# Patient Record
Sex: Female | Born: 1950 | Race: Black or African American | Hispanic: No | Marital: Married | Smoking: Current every day smoker
Health system: Southern US, Community
[De-identification: ages and names within clinical notes are randomized; demographics above are authoritative.]

## PROBLEM LIST (undated history)

## (undated) DIAGNOSIS — E0590 Thyrotoxicosis, unspecified without thyrotoxic crisis or storm: Secondary | ICD-10-CM

## (undated) DIAGNOSIS — R7301 Impaired fasting glucose: Secondary | ICD-10-CM

## (undated) DIAGNOSIS — K219 Gastro-esophageal reflux disease without esophagitis: Secondary | ICD-10-CM

## (undated) DIAGNOSIS — M722 Plantar fascial fibromatosis: Secondary | ICD-10-CM

## (undated) DIAGNOSIS — IMO0001 Reserved for inherently not codable concepts without codable children: Secondary | ICD-10-CM

## (undated) DIAGNOSIS — E669 Obesity, unspecified: Secondary | ICD-10-CM

## (undated) DIAGNOSIS — I10 Essential (primary) hypertension: Secondary | ICD-10-CM

## (undated) DIAGNOSIS — N951 Menopausal and female climacteric states: Secondary | ICD-10-CM

## (undated) HISTORY — DX: Reserved for inherently not codable concepts without codable children: IMO0001

## (undated) HISTORY — DX: Gastro-esophageal reflux disease without esophagitis: K21.9

## (undated) HISTORY — DX: Menopausal and female climacteric states: N95.1

## (undated) HISTORY — DX: Obesity, unspecified: E66.9

## (undated) HISTORY — DX: Thyrotoxicosis, unspecified without thyrotoxic crisis or storm: E05.90

## (undated) HISTORY — DX: Essential (primary) hypertension: I10

## (undated) HISTORY — DX: Plantar fascial fibromatosis: M72.2

## (undated) HISTORY — DX: Impaired fasting glucose: R73.01

## (undated) HISTORY — PX: BUNIONECTOMY: SHX129

## (undated) HISTORY — PX: EYE SURGERY: SHX253

---

## 2009-12-05 HISTORY — PX: BREAST CYST ASPIRATION: SHX578

## 2010-09-22 ENCOUNTER — Ambulatory Visit: Payer: Self-pay | Admitting: Family Medicine

## 2011-10-19 ENCOUNTER — Ambulatory Visit: Payer: Self-pay | Admitting: Nephrology

## 2012-09-26 ENCOUNTER — Ambulatory Visit: Payer: Self-pay

## 2012-10-12 ENCOUNTER — Ambulatory Visit: Payer: Self-pay

## 2012-10-22 ENCOUNTER — Ambulatory Visit: Payer: Self-pay | Admitting: Family Medicine

## 2013-09-04 HISTORY — PX: CHOLECYSTECTOMY: SHX55

## 2013-09-18 ENCOUNTER — Ambulatory Visit: Payer: Self-pay | Admitting: Family Medicine

## 2013-09-26 ENCOUNTER — Ambulatory Visit: Payer: Self-pay | Admitting: Family Medicine

## 2013-09-27 ENCOUNTER — Ambulatory Visit: Payer: Self-pay | Admitting: Surgery

## 2013-09-27 LAB — POTASSIUM: Potassium: 3.2 mmol/L — ABNORMAL LOW (ref 3.5–5.1)

## 2013-10-23 ENCOUNTER — Ambulatory Visit: Payer: Self-pay | Admitting: Family Medicine

## 2013-11-12 ENCOUNTER — Ambulatory Visit: Payer: Self-pay | Admitting: Gastroenterology

## 2014-08-29 ENCOUNTER — Ambulatory Visit: Payer: Self-pay | Admitting: Family Medicine

## 2014-09-04 ENCOUNTER — Ambulatory Visit: Payer: Self-pay | Admitting: Family Medicine

## 2014-10-05 ENCOUNTER — Ambulatory Visit: Payer: Self-pay | Admitting: Family Medicine

## 2014-11-04 ENCOUNTER — Ambulatory Visit: Payer: Self-pay | Admitting: Family Medicine

## 2014-12-11 ENCOUNTER — Ambulatory Visit: Payer: Self-pay | Admitting: Family Medicine

## 2014-12-16 ENCOUNTER — Ambulatory Visit: Payer: Self-pay | Admitting: Family Medicine

## 2015-01-05 ENCOUNTER — Ambulatory Visit: Payer: Self-pay | Admitting: Family Medicine

## 2015-03-27 NOTE — Op Note (Signed)
PATIENT NAME:  Laura Kent, Laura Kent MR#:  [id number] DATE OF BIRTH:  [birth year]  DATE OF PROCEDURE:  09/27/2013  PREOPERATIVE DIAGNOSIS: Symptomatic cholelithiasis.   POSTOPERATIVE DIAGNOSIS: Symptomatic cholelithiasis.   SURGEON: Consuela Mimes, M.D.   ANESTHESIA: General.   OPERATION PERFORMED: Laparoscopic cholecystectomy.  PROCEDURE IN DETAIL: The patient was placed supine on the operating room table and prepped and draped in the usual sterile fashion. A 15 mmHg CO2 pneumoperitoneum was created via a Veress needle in the infraumbilical position and this was replaced with a 5 mm trocar and a 30 degree angled laparoscope. Remaining trocars were placed under direct visualization. The fundus of the gallbladder was covered in adherent omental fat and this was taken down from the visceral surface of the liver and from the visceral surface of the gallbladder with the electrocautery and sharp dissection. The fundus was then retracted superiorly and ventrally and the infundibulum was dissected out and retracted laterally opening up the triangle of Calot. Dissection within the triangle of Calot revealed a clear cystic artery and a clear cystic duct, neither of which were associated with the porta hepatis and both of which were going directly into the gallbladder. These were individually doubly clipped and divided and the gallbladder was removed from the liver bed with the electrocautery. It was packed full of stones and sludge and this could not be dealt with and therefore, the entire gallbladder is placed into an Endo Catch bag and extracted from the abdomen via the epigastric port. This port site midline fascia was opened further because the size of the gallbladder and then the peritoneum was temporarily desufflated and this fascia was closed with a running 0 PDS suture extracorporeally. The peritoneum was reinsufflated and reinspected and the area of dissection was hemostatic with no evidence of bile  staining and the clips were secure. Therefore, the peritoneum was desufflated and decannulated and all 4 skin sites were closed with subcuticular 5-0 Monocryl and suture strips. The patient tolerated the procedure well. There were no complications.  ____________________________ Consuela Mimes, MD wfm:aw D: 09/27/2013 11:25:40 ET T: 09/27/2013 11:39:36 ET JOB#: [id number]  cc: Consuela Mimes, MD, <Dictator> Guadalupe Maple, MD Consuela Mimes MD ELECTRONICALLY SIGNED 09/27/2013 17:03

## 2015-05-19 ENCOUNTER — Ambulatory Visit (INDEPENDENT_AMBULATORY_CARE_PROVIDER_SITE_OTHER): Payer: 59 | Admitting: Family Medicine

## 2015-05-19 ENCOUNTER — Other Ambulatory Visit: Payer: Self-pay | Admitting: Family Medicine

## 2015-05-19 ENCOUNTER — Encounter: Payer: Self-pay | Admitting: Family Medicine

## 2015-05-19 VITALS — BP 138/82 | HR 108 | Temp 98.4°F | Wt 219.0 lb

## 2015-05-19 DIAGNOSIS — I10 Essential (primary) hypertension: Secondary | ICD-10-CM | POA: Diagnosis not present

## 2015-05-19 DIAGNOSIS — E669 Obesity, unspecified: Secondary | ICD-10-CM

## 2015-05-19 DIAGNOSIS — R7301 Impaired fasting glucose: Secondary | ICD-10-CM

## 2015-05-19 DIAGNOSIS — Z23 Encounter for immunization: Secondary | ICD-10-CM

## 2015-05-19 MED ORDER — VALSARTAN-HYDROCHLOROTHIAZIDE 160-25 MG PO TABS: 1.0000 | ORAL_TABLET | Freq: Every day | ORAL | Status: DC

## 2015-05-19 MED ORDER — AMLODIPINE BESYLATE 2.5 MG PO TABS: 2.5000 mg | ORAL_TABLET | Freq: Every day | ORAL | Status: DC

## 2015-05-19 MED ORDER — POTASSIUM CHLORIDE CRYS ER 20 MEQ PO TBCR: 20.0000 meq | EXTENDED_RELEASE_TABLET | Freq: Once | ORAL | Status: DC

## 2015-05-19 NOTE — Assessment & Plan Note (Signed)
See patient instructions; modest weight loss encouraged

## 2015-05-19 NOTE — Assessment & Plan Note (Signed)
Continue arb/thiazide combo plus ccb; modest weight loss, dash guidelines; refills given; check creatinine

## 2015-05-19 NOTE — Assessment & Plan Note (Signed)
Limit sweets; try to deal with stress in healthier ways, other than eating; check A1C and glucose today; modest weight loss recommended

## 2015-05-19 NOTE — Patient Instructions (Addendum)
If you need something for aches or pains, try to use Tylenol (acetaminphen) instead of non-steroidals (which include Aleve, ibuprofen, Advil, Motrin, and naproxen); non-steroidals can cause long-term kidney damage Try to use PLAIN allergy medicine without the decongestant Avoid: phenylephrine, phenylpropanolamine, and pseudoephredine Your goal blood pressure is less than 150 on top Try to follow the DASH guidelines (DASH stands for Dietary Approaches to Stop Hypertension) Try to limit the sodium in your diet.  Ideally, consume less than 1.5 grams (less than 1,500mg ) per day. Do not add salt when cooking or at the table.  Check the sodium amount on labels when shopping, and choose items lower in sodium when given a choice. Avoid or limit foods that already contain a lot of sodium. Eat a diet rich in fruits and vegetables and whole grains. Do try to increase your steps, aim for 6,000 steps a day Check out the information at familydoctor.org entitled "What It Takes to Lose Weight" Try to lose between 1-2 pounds per week by taking in fewer calories and burning off more calories You can succeed by limiting portions, limiting foods dense in calories and fat, becoming more active, and drinking 8 glasses of water a day Don't skip meals, especially breakfast, as skipping meals may alter your metabolism Do not use over-the-counter weight loss pills or gimmicks that claim rapid weight loss A healthy BMI (or body mass index) is between 18.5 and 24.9 You can calculate your ideal BMI at the [place] website [employer]fr If you have not heard anything from my staff in a week about any labs if you have not gotten on to [facility] Please do reconsider getting a colonoscopy (I strongly encourage it), let us know when we can schedule that for you

## 2015-05-19 NOTE — Progress Notes (Signed)
BP 138/82 mmHg  Pulse 108  Temp(Src) 98.4 F (36.9 C)  Wt 219 lb (99.338 kg)  SpO2 97%   Subjective:    Patient ID: Laura Kent, female    DOB: 1951/03/21, 64 y.o.   MRN: [id number]  HPI: Laura Kent is a 64 y.o. female  Chief Complaint  Patient presents with  . Hypertension  . Obesity   Hypertension Patient has had hypertension since about 1996 or so Checking blood pressure away from here?  yes How often? Few times a month Range (low to high) over last two weeks:  130 / 80 Hypertension-associated complications:  none If taking medicines, are you taking them regularly?  yes Siblings / family history: Does high blood pressure run in your family?   YES Salt:  Trying to limit sodium / salt when buying foods at the grocery store?  yes  Do you try to limit added salt when cooking and at the table?  yes Sweets/licorice:  Do you eat a lot of sweets or eat black licorice?  YES, lots of sweets but no  licorice Saturated fats: Do you eat a lot of foods like bacon, sausage, pepperoni, cheeseburgers, hot dogs, bologna, and cheese?  no, just twice a wek Sedentary lifestyle:  Exercise/activity level:  seldom (a few times a month) Steroids/Non-steroidals:  Have you had a recent cortisone shot in the last few months?  no  Do you take prednisone or prescription NSAIDs NO, or take OTCS NSAIDs such as ibuprofen, Motrin, Advil, Aleve, or naproxen? Occasional aleve Smoking: Do you smoke?  no Snoring / sleep apnea: Do you snore or have sleep apnea?  no had the test and no sleep apnea Stress: Do you feel like you are under excessive stress or that your stress level affects your blood pressure at times?    no Stroh's (alcohol): Do you drink alcohol  no Sudafed (decongestants): Do you use any OTC decongestant products like Allegra-D, Claritin-D, Zyrtec-D, Tylenol Cold and Sinus, etc.?  no  Impaired fasting glucose She tends to deal with stress by eating sweets; I asked what else she could  do for stress, crochet, walking; calling a friend might stress her out; not getting enough sleep; not walking regularly  Obesity She has not lost any weight since last visit; does not exercise; deals with stress by eating  Health maintenance She does not want a colonoscopy; she refused stool cards; she is willing to have shingles vaccine  Relevant past medical, surgical, family and social history reviewed and updated as indicated. Interim medical history since our last visit reviewed. Allergies and medications reviewed and updated.  Review of Systems  Constitutional: Negative for fever.  Cardiovascular: Positive for leg swelling (little bit near the end of the day after being on feet). Negative for chest pain.  Gastrointestinal: Negative for blood in stool.  Psychiatric/Behavioral: Negative for dysphoric mood.   Per HPI unless specifically indicated above     Objective:    BP 138/82 mmHg  Pulse 108  Temp(Src) 98.4 F (36.9 C)  Wt 219 lb (99.338 kg)  SpO2 97%  Wt Readings from Last 3 Encounters:  05/19/15 219 lb (99.338 kg)  11/12/14 217 lb (98.431 kg)    Physical Exam  Constitutional: She appears well-developed and well-nourished. No distress.  obese  HENT:  Head: Normocephalic and atraumatic.  Eyes: EOM are normal. No scleral icterus.  Neck: No thyromegaly present.  Cardiovascular: Normal rate, regular rhythm and normal heart sounds.   No  murmur heard. Pulmonary/Chest: Effort normal and breath sounds normal. No respiratory distress. She has no wheezes.  Abdominal: Soft. She exhibits no distension.  Musculoskeletal: Normal range of motion. She exhibits no edema.  Neurological: She is alert. She exhibits normal muscle tone.  Skin: Skin is warm and dry. She is not diaphoretic. No pallor.  Psychiatric: She has a normal mood and affect. Her behavior is normal. Judgment and thought content normal.      Assessment & Plan:   Problem List Items Addressed This Visit       Cardiovascular and Mediastinum   Benign hypertension    Continue arb/thiazide combo plus ccb; modest weight loss, dash guidelines; refills given; check creatinine      Relevant Medications   amLODipine (NORVASC) 2.5 MG tablet   valsartan-hydrochlorothiazide (DIOVAN-HCT) 160-25 MG per tablet   Other Relevant Orders   Comprehensive metabolic panel     Endocrine   Impaired fasting glucose    Limit sweets; try to deal with stress in healthier ways, other than eating; check A1C and glucose today; modest weight loss recommended      Relevant Orders   Hgb A1c w/o eAG     Other   Obesity    See patient instructions; modest weight loss encouraged       Other Visit Diagnoses    Need for vaccination    -  Primary    Relevant Orders    Varicella-zoster vaccine subcutaneous       Patient declined offer for stool cards, colonoscopy  Follow up plan: Return in about 6 months (around 11/18/2015) for physical.

## 2015-05-19 NOTE — Addendum Note (Signed)
Addended by: Tasmin Exantus, Satira Anis on: 05/19/2015 09:57 AM   Modules accepted: Miquel Dunn

## 2015-05-20 LAB — COMPREHENSIVE METABOLIC PANEL
ALK PHOS: 80 IU/L (ref 39–117)
ALT: 13 IU/L (ref 0–32)
AST: 13 IU/L (ref 0–40)
Albumin/Globulin Ratio: 1.6 (ref 1.1–2.5)
Albumin: 4.1 g/dL (ref 3.6–4.8)
BUN / CREAT RATIO: 18 (ref 11–26)
BUN: 13 mg/dL (ref 8–27)
CHLORIDE: 104 mmol/L (ref 97–108)
CO2: 26 mmol/L (ref 18–29)
Calcium: 9.7 mg/dL (ref 8.7–10.3)
Creatinine, Ser: 0.72 mg/dL (ref 0.57–1.00)
GFR calc non Af Amer: 90 mL/min/{1.73_m2} (ref >59)
GFR, EST AFRICAN AMERICAN: 104 mL/min/{1.73_m2} (ref >59)
Globulin, Total: 2.5 g/dL (ref 1.5–4.5)
Glucose: 99 mg/dL (ref 65–99)
POTASSIUM: 3.7 mmol/L (ref 3.5–5.2)
SODIUM: 144 mmol/L (ref 134–144)
Total Protein: 6.6 g/dL (ref 6.0–8.5)

## 2015-05-20 LAB — HGB A1C W/O EAG: Hgb A1c MFr Bld: 6.3 % — ABNORMAL HIGH (ref 4.8–5.6)

## 2015-05-21 ENCOUNTER — Encounter: Payer: Self-pay | Admitting: Family Medicine

## 2015-05-21 ENCOUNTER — Telehealth: Payer: Self-pay | Admitting: Family Medicine

## 2015-05-21 MED ORDER — POTASSIUM CHLORIDE CRYS ER 20 MEQ PO TBCR: 20.0000 meq | EXTENDED_RELEASE_TABLET | Freq: Every day | ORAL | Status: DC

## 2015-05-21 NOTE — Telephone Encounter (Signed)
Please let GALIA RAHM know that I'd like to see patient for an appointment here in the office for:  Hypertension, prediabetes, obesity Please schedule a visit with me  in the next: 2-3 weeks Fasting?  Yes please Thank you, Dr. Sanda Klein

## 2015-05-21 NOTE — Telephone Encounter (Signed)
Appt: 6/28 in am.

## 2015-05-21 NOTE — Telephone Encounter (Signed)
E-Fax came through for reill: WI:[id number] chloride SA (K-DUR,KLOR-CON) 20 MEQ tablet

## 2015-06-02 ENCOUNTER — Encounter: Payer: Self-pay | Admitting: Family Medicine

## 2015-06-02 ENCOUNTER — Ambulatory Visit (INDEPENDENT_AMBULATORY_CARE_PROVIDER_SITE_OTHER): Payer: 59 | Admitting: Family Medicine

## 2015-06-02 VITALS — BP 108/77 | HR 96 | Temp 98.1°F | Wt 219.0 lb

## 2015-06-02 DIAGNOSIS — R7301 Impaired fasting glucose: Secondary | ICD-10-CM

## 2015-06-02 DIAGNOSIS — I10 Essential (primary) hypertension: Secondary | ICD-10-CM

## 2015-06-02 DIAGNOSIS — E669 Obesity, unspecified: Secondary | ICD-10-CM

## 2015-06-02 NOTE — Assessment & Plan Note (Signed)
Encouraged walking

## 2015-06-02 NOTE — Assessment & Plan Note (Signed)
As before in earlier note

## 2015-06-02 NOTE — Progress Notes (Signed)
Patient is here in error.  She was just here in the middle of June, had labs done, and then I sent a message to my staff asking her to come in for a visit for obesity, HTN, prediabetes.  She had just been here and had labs and did not need to be seen.  I am not charging her for today's visit since this was not necessary.  -- Dr. Sanda Klein  BP 108/77 mmHg  Pulse 96  Temp(Src) 98.1 F (36.7 C)  Wt 219 lb (99.338 kg)  SpO2 98%   Subjective:    Patient ID: Laura Kent, female    DOB: 1951-05-12, 64 y.o.   MRN: [id number]  HPI: CHARLE MCLAURIN is a 64 y.o. female  Chief Complaint  Patient presents with  . Discuss Labs  . Hypertension  . Obesity  . Hyperglycemia    IFG   Hypertension Patient has had hypertension since 10+ years Checking blood pressure away from here?  yes Range: 120s over 70s or 80s Feels blood pressure is under very good control Hypertension-associated complications:  none If taking medicines, are you taking them regularly?  yes Siblings / family history: Does high blood pressure run in your family?   YES Salt:  Trying to limit sodium / salt when buying foods at the grocery store?  yes  Do you try to limit added salt when cooking and at the table?  yes Sweets/licorice:  Do you eat a lot of sweets or eat black licorice?  YES, encouraged to limit Saturated fats: Do you eat a lot of foods like bacon, sausage, pepperoni, cheeseburgers, hot dogs, bologna, and cheese?  no, occasional hot dog Sedentary lifestyle:  Exercise/activity level:  sedentary (essential NO activity) Steroids/Non-steroidals:  Have you had a recent cortisone shot in the last few months?  no  Do you take prednisone or prescription NSAIDs, no, or take OTCS NSAIDs such as ibuprofen, Motrin, Advil, Aleve, or naproxen? no Smoking: Do you smoke?  no Snoring / sleep apnea: Do you snore or have sleep apnea?  no Stress: Do you feel like you are under excessive stress or that your stress level affects your  blood pressure at times?    no Stroh's (alcohol): Do you drink alcohol  no Sudafed (decongestants): Do you use any OTC decongestant products like Allegra-D, Claritin-D, Zyrtec-D, Tylenol Cold and Sinus, etc.?  YES   Prediabetes Family hx positive for mother, brother, and sister Patient has had prediabetes since 3 years Checking sugars?  no Trying to limit white bread, white rice, white potatoes, sweets?  no Trying to limit sweetened drinks like iced tea, soft drinks, sports drinks, fruit juices?  yes, tea just once a week Exercise/activity level:  sedentary (essential no activity)   Relevant past medical, surgical, family and social history reviewed and updated as indicated. Interim medical history since our last visit reviewed. Allergies and medications reviewed and updated.  Review of Systems  Cardiovascular: Negative for chest pain and leg swelling.       Ankles swell at the end of the day when sitting all day  Gastrointestinal: Negative for blood in stool.  Genitourinary: Negative for hematuria.   Per HPI unless specifically indicated above     Objective:    BP 108/77 mmHg  Pulse 96  Temp(Src) 98.1 F (36.7 C)  Wt 219 lb (99.338 kg)  SpO2 98%  Wt Readings from Last 3 Encounters:  06/02/15 219 lb (99.338 kg)  05/19/15 219 lb (99.338 kg)  11/12/14 217 lb (98.431 kg)    Physical Exam  Constitutional: She appears well-developed and well-nourished.  obese  Cardiovascular: Normal rate and regular rhythm.   Pulmonary/Chest: Effort normal and breath sounds normal.  Musculoskeletal: She exhibits no edema.    Results for orders placed or performed in visit on 05/19/15  Hgb A1c w/o eAG  Result Value Ref Range   Hgb A1c MFr Bld 6.3 (H) 4.8 - 5.6 %  Comprehensive metabolic panel  Result Value Ref Range   Glucose 99 65 - 99 mg/dL   BUN 13 8 - 27 mg/dL   Creatinine, Ser 0.72 0.57 - 1.00 mg/dL   GFR calc non Af Amer 90 >59 mL/min/1.73   GFR calc Af Amer 104 >59 mL/min/1.73    BUN/Creatinine Ratio 18 11 - 26   Sodium 144 134 - 144 mmol/L   Potassium 3.7 3.5 - 5.2 mmol/L   Chloride 104 97 - 108 mmol/L   CO2 26 18 - 29 mmol/L   Calcium 9.7 8.7 - 10.3 mg/dL   Total Protein 6.6 6.0 - 8.5 g/dL   Albumin 4.1 3.6 - 4.8 g/dL   Globulin, Total 2.5 1.5 - 4.5 g/dL   Albumin/Globulin Ratio 1.6 1.1 - 2.5   Bilirubin Total <0.2 0.0 - 1.2 mg/dL   Alkaline Phosphatase 80 39 - 117 IU/L   AST 13 0 - 40 IU/L   ALT 13 0 - 32 IU/L      Assessment & Plan:   Problem List Items Addressed This Visit    None       Follow up plan: No Follow-up on file.

## 2015-06-02 NOTE — Patient Instructions (Addendum)
Let's have you start walking 3 days per week, start at 10 minutes a day and build up gradually Limit whites (white bread, white rice, white potatoes, and sugar) Next A1C is going to be due right around December 15th or just after NO CHARGE for today, since I just saw you two weeks ago

## 2015-06-02 NOTE — Assessment & Plan Note (Signed)
As before in earlier note; next A1C Dec 15th or just after

## 2015-07-21 ENCOUNTER — Other Ambulatory Visit: Payer: Self-pay | Admitting: Family Medicine

## 2015-07-21 NOTE — Telephone Encounter (Signed)
Routing to provider  

## 2015-07-21 NOTE — Telephone Encounter (Signed)
Last potassium reviewed; checked while on ARB, thiazide combo Stable Rx approved

## 2015-10-19 ENCOUNTER — Other Ambulatory Visit: Payer: Self-pay | Admitting: Family Medicine

## 2015-10-19 NOTE — Telephone Encounter (Signed)
Your patient.  Thanks 

## 2015-10-19 NOTE — Telephone Encounter (Signed)
Labs from June 2016 reviewed; rx approved

## 2015-11-16 ENCOUNTER — Ambulatory Visit (INDEPENDENT_AMBULATORY_CARE_PROVIDER_SITE_OTHER): Payer: 59 | Admitting: Family Medicine

## 2015-11-16 ENCOUNTER — Encounter: Payer: Self-pay | Admitting: Family Medicine

## 2015-11-16 VITALS — BP 161/72 | HR 98 | Temp 97.7°F | Ht 65.0 in | Wt 219.0 lb

## 2015-11-16 DIAGNOSIS — R7301 Impaired fasting glucose: Secondary | ICD-10-CM

## 2015-11-16 DIAGNOSIS — Z0000 Encounter for general adult medical examination without abnormal findings: Secondary | ICD-10-CM | POA: Insufficient documentation

## 2015-11-16 DIAGNOSIS — Z1159 Encounter for screening for other viral diseases: Secondary | ICD-10-CM | POA: Diagnosis not present

## 2015-11-16 DIAGNOSIS — E669 Obesity, unspecified: Secondary | ICD-10-CM

## 2015-11-16 DIAGNOSIS — Z124 Encounter for screening for malignant neoplasm of cervix: Secondary | ICD-10-CM | POA: Diagnosis not present

## 2015-11-16 DIAGNOSIS — Z1231 Encounter for screening mammogram for malignant neoplasm of breast: Secondary | ICD-10-CM

## 2015-11-16 DIAGNOSIS — I10 Essential (primary) hypertension: Secondary | ICD-10-CM

## 2015-11-16 DIAGNOSIS — Z114 Encounter for screening for human immunodeficiency virus [HIV]: Secondary | ICD-10-CM | POA: Diagnosis not present

## 2015-11-16 DIAGNOSIS — M79601 Pain in right arm: Secondary | ICD-10-CM | POA: Diagnosis not present

## 2015-11-16 MED ORDER — POTASSIUM CHLORIDE CRYS ER 20 MEQ PO TBCR: 20.0000 meq | EXTENDED_RELEASE_TABLET | Freq: Every day | ORAL | Status: DC

## 2015-11-16 MED ORDER — VALSARTAN-HYDROCHLOROTHIAZIDE 160-25 MG PO TABS: 1.0000 | ORAL_TABLET | Freq: Every day | ORAL | Status: DC

## 2015-11-16 MED ORDER — AMLODIPINE BESYLATE 2.5 MG PO TABS: 2.5000 mg | ORAL_TABLET | Freq: Every day | ORAL | Status: DC

## 2015-11-16 NOTE — Assessment & Plan Note (Addendum)
Check A1c today along with fasting glucose; weight loss important to keep from developing frank diabetes

## 2015-11-16 NOTE — Progress Notes (Signed)
Patient ID: Laura Kent, female   DOB: 1951/12/02, 64 y.o.   MRN: [id number]  Subjective:   Laura Kent is a 64 y.o. female here for a complete physical exam  Interim issues since last visit: no medical excitement  USPSTF grade A and B recommendations Alcohol: well under limit of 7 per week, just holidays Depression:  Depression screen [facility] 2/9 11/16/2015  Decreased Interest 0  Down, Depressed, Hopeless 0  PHQ - 2 Score 0   Hypertension: high today; little stressed about physical part Obesity: stable; not active enough; no real hang-up Tobacco use: used to smoke, quit 2003; did not have 30 pack years HIV, hep B, hep C: HIV, hep C STD testing and prevention (chl/gon/syphilis): declined Lipids: today, fasting Glucose: today, fasting Colorectal cancer: due June 2017 Breast cancer: done Jan 2016; no  Lumps or bumps BRCA gene screening: no fam hx Intimate partner violence: no abuse Cervical cancer screening: today Lung cancer: not 30 pack years Osteoporosis: start at age 60 Fall prevention/vitamin D: not taking vit D AAA: n/a Aspirin: daily aspirin Diet: no fast foods, not enough fruits and veggies; no more than 3 eggs per week Exercise: does not move enough at work; just sitting again; no pets; no standing desk Skin cancer: no worrisome moles; saw dermatologist about mole on leg  Past Medical History  Diagnosis Date  . Hypertension   . IFG (impaired fasting glucose)   . Reflux   . Menopausal syndrome   . Hyperthyroidism     Treated with radioactive iodine 2014  . Obesity    Past Surgical History  Procedure Laterality Date  . Cholecystectomy  Oct 2014  . Bunionectomy     Family History  Problem Relation Age of Onset  . Diabetes Mother   . Kidney disease Mother   . Heart disease Mother   . Hypertension Mother   . Diabetes Father   . Heart disease Father   . Hypertension Father   . Diabetes Sister   . Cancer Brother     leukemia  . Heart disease  Brother     tachycardia  . Stroke Brother     fatal   Social History  Substance Use Topics  . Smoking status: Former Smoker    Quit date: 05/18/2012  . Smokeless tobacco: Never Used  . Alcohol Use: No   Review of Systems  Constitutional: Negative for fever and chills.  HENT: Negative for sore throat.   Respiratory: Negative for shortness of breath and wheezing.   Cardiovascular: Negative for chest pain and leg swelling.  Gastrointestinal: Negative for blood in stool.  Endocrine: Negative for polydipsia.  Genitourinary: Negative for hematuria.  Musculoskeletal:       Rights houlder/arm a little sore after using over thanksgiving; trouble with abduction  Skin:       No worrisome moles  Allergic/Immunologic: Negative for food allergies.  Neurological: Negative for tremors.  Hematological: Does not bruise/bleed easily.  Psychiatric/Behavioral: Negative for dysphoric mood.   Objective:   Filed Vitals:   11/16/15 0907  BP: 161/72  Pulse: 98  Temp: 97.7 F (36.5 C)  Height: '5\' 5"'  (1.651 m)  Weight: 219 lb (99.338 kg)  SpO2: 97%    Body mass index is 36.44 kg/(m^2). Wt Readings from Last 3 Encounters:  11/16/15 219 lb (99.338 kg)  06/02/15 219 lb (99.338 kg)  05/19/15 219 lb (99.338 kg)   Physical Exam  Constitutional: She appears well-developed and well-nourished.  HENT:  Head: Normocephalic  and atraumatic.  Eyes: Conjunctivae and EOM are normal. Right eye exhibits no hordeolum. Left eye exhibits no hordeolum. No scleral icterus.  Neck: Carotid bruit is not present. No thyromegaly present.  Cardiovascular: Normal rate, regular rhythm, S1 normal, S2 normal and normal heart sounds.   No extrasystoles are present.  Pulmonary/Chest: Effort normal and breath sounds normal. No respiratory distress. Right breast exhibits no inverted nipple, no mass, no nipple discharge, no skin change and no tenderness. Left breast exhibits no inverted nipple, no mass, no nipple discharge, no  skin change and no tenderness. Breasts are symmetrical.  Abdominal: Soft. Normal appearance and bowel sounds are normal. She exhibits no distension, no abdominal bruit, no pulsatile midline mass and no mass. There is no hepatosplenomegaly. There is no tenderness. No hernia.  Genitourinary: Uterus normal. Pelvic exam was performed with patient prone. There is no rash or lesion on the right labia. There is no rash or lesion on the left labia. Cervix exhibits no motion tenderness. Right adnexum displays no mass, no tenderness and no fullness. Left adnexum displays no mass, no tenderness and no fullness.  Musculoskeletal: She exhibits no edema.       Right shoulder: She exhibits decreased range of motion.  Lymphadenopathy:       Head (right side): No submandibular adenopathy present.       Head (left side): No submandibular adenopathy present.    She has no cervical adenopathy.    She has no axillary adenopathy.  Neurological: She is alert. She displays no tremor. No cranial nerve deficit. She exhibits normal muscle tone. Gait normal.  Skin: Skin is warm and dry. No bruising and no ecchymosis noted. No cyanosis. No pallor.  Psychiatric: Her speech is normal and behavior is normal. Thought content normal. Her mood appears not anxious. She does not exhibit a depressed mood.    Assessment/Plan:   Problem List Items Addressed This Visit      Cardiovascular and Mediastinum   Benign hypertension    I thought patient's blood pressure was rechecked during her visit here, but I do not see one recorded as I type her note; weight loss was encouraged; DASH guidelines recommended; I will put in a phone note to staff to contact her and ask her to check her BP a few times a week for the next few weeks and call if not under 325 systolic; continue ARB, thiazide, and CCB      Relevant Medications   amLODipine (NORVASC) 2.5 MG tablet   valsartan-hydrochlorothiazide (DIOVAN-HCT) 160-25 MG tablet     Endocrine    Impaired fasting glucose    Check A1c today along with fasting glucose; weight loss important to keep from developing frank diabetes      Relevant Orders   Hgb A1c w/o eAG (Completed)     Other   Obesity    Encouraged weight loss; see AVS      Preventative health care - Primary    USPSTF grade A and B recommendations reviewed with patient; age-appropriate recommendations, preventive care, screening tests, etc discussed and encouraged; healthy living encouraged; see AVS for patient education given to patient      Relevant Orders   TSH (Completed)   CBC with Differential/Platelet (Completed)   Comprehensive metabolic panel (Completed)   Lipid Panel w/o Chol/HDL Ratio (Completed)   Right arm pain    Suspect element of impingement syndrome; will have her try "wall walking" and contact me if she desires PT or ortho referral  Screening for cervical cancer    Thin prep collected today      Relevant Orders   Pap liquid-based and HPV (high risk) (Completed)    Other Visit Diagnoses    Visit for screening mammogram        ordered screening mammogram    Relevant Orders    MM SCREENING BREAST TOMO BILATERAL    Need for hepatitis C screening test        one-time screen per USPSTF guidelines    Relevant Orders    Hepatitis C antibody (Completed)    Screening for HIV (human immunodeficiency virus)        one-time screen per USPSTF guidelines    Relevant Orders    HIV antibody (Completed)       Meds ordered this encounter  Medications  . amLODipine (NORVASC) 2.5 MG tablet    Sig: Take 1 tablet (2.5 mg total) by mouth daily.    Dispense:  90 tablet    Refill:  3  . valsartan-hydrochlorothiazide (DIOVAN-HCT) 160-25 MG tablet    Sig: Take 1 tablet by mouth daily.    Dispense:  90 tablet    Refill:  3  . potassium chloride SA (K-DUR,KLOR-CON) 20 MEQ tablet    Sig: Take 1 tablet (20 mEq total) by mouth daily.    Dispense:  90 tablet    Refill:  0   Orders Placed This  Encounter  Procedures  . MM SCREENING BREAST TOMO BILATERAL    Standing Status: Future     Number of Occurrences:      Standing Expiration Date: 01/16/2017    Order Specific Question:  Reason for Exam (SYMPTOM  OR DIAGNOSIS REQUIRED)    Answer:  yearly screening    Order Specific Question:  Preferred imaging location?    Answer:  [facility]  . TSH  . CBC with Differential/Platelet  . Comprehensive metabolic panel    Order Specific Question:  Has the patient fasted?    Answer:  Yes  . Lipid Panel w/o Chol/HDL Ratio    Order Specific Question:  Has the patient fasted?    Answer:  Yes  . Hgb A1c w/o eAG  . Hepatitis C antibody  . HIV antibody    Follow up plan: Return in about 1 year (around 11/15/2016) for complete physical, and 6 months for next regular f/u.  An after-visit summary was printed and given to the patient at [place].  Please see the patient instructions which may contain other information and recommendations beyond what is mentioned above in the assessment and plan.  Orders Placed This Encounter  Procedures  . MM SCREENING BREAST TOMO BILATERAL  . TSH  . CBC with Differential/Platelet  . Comprehensive metabolic panel  . Lipid Panel w/o Chol/HDL Ratio  . Hgb A1c w/o eAG  . Hepatitis C antibody  . HIV antibody   Meds ordered this encounter  Medications  . amLODipine (NORVASC) 2.5 MG tablet    Sig: Take 1 tablet (2.5 mg total) by mouth daily.    Dispense:  90 tablet    Refill:  3  . valsartan-hydrochlorothiazide (DIOVAN-HCT) 160-25 MG tablet    Sig: Take 1 tablet by mouth daily.    Dispense:  90 tablet    Refill:  3  . potassium chloride SA (K-DUR,KLOR-CON) 20 MEQ tablet    Sig: Take 1 tablet (20 mEq total) by mouth daily.    Dispense:  90 tablet    Refill:  0  Medications Discontinued During This Encounter  Medication Reason  . HYDROcodone-acetaminophen (NORCO/VICODIN) 5-325 MG per tablet Patient has not taken in last 30 days  . amLODipine  (NORVASC) 2.5 MG tablet Reorder  . valsartan-hydrochlorothiazide (DIOVAN-HCT) 160-25 MG per tablet Reorder  . potassium chloride SA (K-DUR,KLOR-CON) 20 MEQ tablet Reorder

## 2015-11-16 NOTE — Patient Instructions (Addendum)
Try some "walk walking" to keep that right shoulder from getting stuck Check out the information at familydoctor.org entitled "What It Takes to Lose Weight" Try to lose between 1-2 pounds per week by taking in fewer calories and burning off more calories You can succeed by limiting portions, limiting foods dense in calories and fat, becoming more active, and drinking 8 glasses of water a day (64 ounces) Don't skip meals, especially breakfast, as skipping meals may alter your metabolism Do not use over-the-counter weight loss pills or gimmicks that claim rapid weight loss A healthy BMI (or body mass index) is between 18.5 and 24.9 You can calculate your ideal BMI at the [place] website [employer]fr  Health Maintenance, Female Adopting a healthy lifestyle and getting preventive care can go a long way to promote health and wellness. Talk with your health care provider about what schedule of regular examinations is right for you. This is a good chance for you to check in with your provider about disease prevention and staying healthy. In between checkups, there are plenty of things you can do on your own. Experts have done a lot of research about which lifestyle changes and preventive measures are most likely to keep you healthy. Ask your health care provider for more information. WEIGHT AND DIET  Eat a healthy diet  Be sure to include plenty of vegetables, fruits, low-fat dairy products, and lean protein.  Do not eat a lot of foods high in solid fats, added sugars, or salt.  Get regular exercise. This is one of the most important things you can do for your health.  Most adults should exercise for at least 150 minutes each week. The exercise should increase your heart rate and make you sweat (moderate-intensity exercise).  Most adults should also do strengthening exercises at least twice a week. This is in addition to the moderate-intensity  exercise.  Maintain a healthy weight  Body mass index (BMI) is a measurement that can be used to identify possible weight problems. It estimates body fat based on height and weight. Your health care provider can help determine your BMI and help you achieve or maintain a healthy weight.  For females 58 years of age and older:   A BMI below 18.5 is considered underweight.  A BMI of 18.5 to 24.9 is normal.  A BMI of 25 to 29.9 is considered overweight.  A BMI of 30 and above is considered obese.  Watch levels of cholesterol and blood lipids  You should start having your blood tested for lipids and cholesterol at 63 years of age, then have this test every 5 years.  You may need to have your cholesterol levels checked more often if:  Your lipid or cholesterol levels are high.  You are older than 63 years of age.  You are at high risk for heart disease.  CANCER SCREENING   Lung Cancer  Lung cancer screening is recommended for adults 31-50 years old who are at high risk for lung cancer because of a history of smoking.  A yearly low-dose CT scan of the lungs is recommended for people who:  Currently smoke.  Have quit within the past 15 years.  Have at least a 30-pack-year history of smoking. A pack year is smoking an average of one pack of cigarettes a day for 1 year.  Yearly screening should continue until it has been 15 years since you quit.  Yearly screening should stop if you develop a health problem that would prevent  you from having lung cancer treatment.  Breast Cancer  Practice breast self-awareness. This means understanding how your breasts normally appear and feel.  It also means doing regular breast self-exams. Let your health care provider know about any changes, no matter how small.  If you are in your 20s or 30s, you should have a clinical breast exam (CBE) by a health care provider every 1-3 years as part of a regular health exam.  If you are 49 or  older, have a CBE every year. Also consider having a breast X-ray (mammogram) every year.  If you have a family history of breast cancer, talk to your health care provider about genetic screening.  If you are at high risk for breast cancer, talk to your health care provider about having an MRI and a mammogram every year.  Breast cancer gene (BRCA) assessment is recommended for women who have family members with BRCA-related cancers. BRCA-related cancers include:  Breast.  Ovarian.  Tubal.  Peritoneal cancers.  Results of the assessment will determine the need for genetic counseling and BRCA1 and BRCA2 testing. Cervical Cancer Your health care provider may recommend that you be screened regularly for cancer of the pelvic organs (ovaries, uterus, and vagina). This screening involves a pelvic examination, including checking for microscopic changes to the surface of your cervix (Pap test). You may be encouraged to have this screening done every 3 years, beginning at age 53.  For women ages 30-65, health care providers may recommend pelvic exams and Pap testing every 3 years, or they may recommend the Pap and pelvic exam, combined with testing for human papilloma virus (HPV), every 5 years. Some types of HPV increase your risk of cervical cancer. Testing for HPV may also be done on women of any age with unclear Pap test results.  Other health care providers may not recommend any screening for nonpregnant women who are considered low risk for pelvic cancer and who do not have symptoms. Ask your health care provider if a screening pelvic exam is right for you.  If you have had past treatment for cervical cancer or a condition that could lead to cancer, you need Pap tests and screening for cancer for at least 20 years after your treatment. If Pap tests have been discontinued, your risk factors (such as having a new sexual partner) need to be reassessed to determine if screening should resume. Some  women have medical problems that increase the chance of getting cervical cancer. In these cases, your health care provider may recommend more frequent screening and Pap tests. Colorectal Cancer  This type of cancer can be detected and often prevented.  Routine colorectal cancer screening usually begins at 63 years of age and continues through 63 years of age.  Your health care provider may recommend screening at an earlier age if you have risk factors for colon cancer.  Your health care provider may also recommend using home test kits to check for hidden blood in the stool.  A small camera at the end of a tube can be used to examine your colon directly (sigmoidoscopy or colonoscopy). This is done to check for the earliest forms of colorectal cancer.  Routine screening usually begins at age 40.  Direct examination of the colon should be repeated every 5-10 years through 63 years of age. However, you may need to be screened more often if early forms of precancerous polyps or small growths are found. Skin Cancer  Check your skin from head to  toe regularly.  Tell your health care provider about any new moles or changes in moles, especially if there is a change in a mole's shape or color.  Also tell your health care provider if you have a mole that is larger than the size of a pencil eraser.  Always use sunscreen. Apply sunscreen liberally and repeatedly throughout the day.  Protect yourself by wearing long sleeves, pants, a wide-brimmed hat, and sunglasses whenever you are outside. HEART DISEASE, DIABETES, AND HIGH BLOOD PRESSURE   High blood pressure causes heart disease and increases the risk of stroke. High blood pressure is more likely to develop in:  People who have blood pressure in the high end of the normal range (130-139/85-89 mm Hg).  People who are overweight or obese.  People who are African American.  If you are 41-43 years of age, have your blood pressure checked every  3-5 years. If you are 31 years of age or older, have your blood pressure checked every year. You should have your blood pressure measured twice--once when you are at a hospital or clinic, and once when you are not at a hospital or clinic. Record the average of the two measurements. To check your blood pressure when you are not at a hospital or clinic, you can use:  An automated blood pressure machine at a pharmacy.  A home blood pressure monitor.  If you are between 62 years and 47 years old, ask your health care provider if you should take aspirin to prevent strokes.  Have regular diabetes screenings. This involves taking a blood sample to check your fasting blood sugar level.  If you are at a normal weight and have a low risk for diabetes, have this test once every three years after 63 years of age.  If you are overweight and have a high risk for diabetes, consider being tested at a younger age or more often. PREVENTING INFECTION  Hepatitis B  If you have a higher risk for hepatitis B, you should be screened for this virus. You are considered at high risk for hepatitis B if:  You were born in a country where hepatitis B is common. Ask your health care provider which countries are considered high risk.  Your parents were born in a high-risk country, and you have not been immunized against hepatitis B (hepatitis B vaccine).  You have HIV or AIDS.  You use needles to inject street drugs.  You live with someone who has hepatitis B.  You have had sex with someone who has hepatitis B.  You get hemodialysis treatment.  You take certain medicines for conditions, including cancer, organ transplantation, and autoimmune conditions. Hepatitis C  Blood testing is recommended for:  Everyone born from [birth year] through 1964.  Anyone with known risk factors for hepatitis C. Sexually transmitted infections (STIs)  You should be screened for sexually transmitted infections (STIs) including  gonorrhea and chlamydia if:  You are sexually active and are younger than 63 years of age.  You are older than 63 years of age and your health care provider tells you that you are at risk for this type of infection.  Your sexual activity has changed since you were last screened and you are at an increased risk for chlamydia or gonorrhea. Ask your health care provider if you are at risk.  If you do not have HIV, but are at risk, it may be recommended that you take a prescription medicine daily to prevent HIV infection. This  is called pre-exposure prophylaxis (PrEP). You are considered at risk if:  You are sexually active and do not regularly use condoms or know the HIV status of your partner(s).  You take drugs by injection.  You are sexually active with a partner who has HIV. Talk with your health care provider about whether you are at high risk of being infected with HIV. If you choose to begin PrEP, you should first be tested for HIV. You should then be tested every 3 months for as long as you are taking PrEP.  PREGNANCY   If you are premenopausal and you may become pregnant, ask your health care provider about preconception counseling.  If you may become pregnant, take 400 to 800 micrograms (mcg) of folic acid every day.  If you want to prevent pregnancy, talk to your health care provider about birth control (contraception). OSTEOPOROSIS AND MENOPAUSE   Osteoporosis is a disease in which the bones lose minerals and strength with aging. This can result in serious bone fractures. Your risk for osteoporosis can be identified using a bone density scan.  If you are 28 years of age or older, or if you are at risk for osteoporosis and fractures, ask your health care provider if you should be screened.  Ask your health care provider whether you should take a calcium or vitamin D supplement to lower your risk for osteoporosis.  Menopause may have certain physical symptoms and  risks.  Hormone replacement therapy may reduce some of these symptoms and risks. Talk to your health care provider about whether hormone replacement therapy is right for you.  HOME CARE INSTRUCTIONS   Schedule regular health, dental, and eye exams.  Stay current with your immunizations.   Do not use any tobacco products including cigarettes, chewing tobacco, or electronic cigarettes.  If you are pregnant, do not drink alcohol.  If you are breastfeeding, limit how much and how often you drink alcohol.  Limit alcohol intake to no more than 1 drink per day for nonpregnant women. One drink equals 12 ounces of beer, 5 ounces of wine, or 1 ounces of hard liquor.  Do not use street drugs.  Do not share needles.  Ask your health care provider for help if you need support or information about quitting drugs.  Tell your health care provider if you often feel depressed.  Tell your health care provider if you have ever been abused or do not feel safe at home.   This information is not intended to replace advice given to you by your health care provider. Make sure you discuss any questions you have with your health care provider.   Document Released: 06/06/2011 Document Revised: 12/12/2014 Document Reviewed: 10/23/2013 Elsevier Interactive Patient Education [employer].

## 2015-11-16 NOTE — Assessment & Plan Note (Signed)
Encouraged weight loss; see AVS 

## 2015-11-17 ENCOUNTER — Encounter: Payer: Self-pay | Admitting: Family Medicine

## 2015-11-17 LAB — CBC WITH DIFFERENTIAL/PLATELET
BASOS ABS: 0 10*3/uL (ref 0.0–0.2)
BASOS: 0 %
EOS (ABSOLUTE): 0.1 10*3/uL (ref 0.0–0.4)
Eos: 1 %
Hematocrit: 43.4 % (ref 34.0–46.6)
Hemoglobin: 14.6 g/dL (ref 11.1–15.9)
IMMATURE GRANS (ABS): 0 10*3/uL (ref 0.0–0.1)
IMMATURE GRANULOCYTES: 0 %
LYMPHS: 39 %
Lymphocytes Absolute: 2.3 10*3/uL (ref 0.7–3.1)
MCH: 28.9 pg (ref 26.6–33.0)
MCHC: 33.6 g/dL (ref 31.5–35.7)
MCV: 86 fL (ref 79–97)
Monocytes Absolute: 0.4 10*3/uL (ref 0.1–0.9)
Monocytes: 6 %
NEUTROS PCT: 54 %
Neutrophils Absolute: 3.1 10*3/uL (ref 1.4–7.0)
PLATELETS: 317 10*3/uL (ref 150–379)
RBC: 5.05 x10E6/uL (ref 3.77–5.28)
RDW: 14.9 % (ref 12.3–15.4)
WBC: 5.8 10*3/uL (ref 3.4–10.8)

## 2015-11-17 LAB — COMPREHENSIVE METABOLIC PANEL
ALT: 13 IU/L (ref 0–32)
AST: 14 IU/L (ref 0–40)
Albumin/Globulin Ratio: 1.7 (ref 1.1–2.5)
Albumin: 4.3 g/dL (ref 3.6–4.8)
Alkaline Phosphatase: 78 IU/L (ref 39–117)
BUN/Creatinine Ratio: 19 (ref 11–26)
BUN: 14 mg/dL (ref 8–27)
Bilirubin Total: 0.5 mg/dL (ref 0.0–1.2)
CALCIUM: 9.7 mg/dL (ref 8.7–10.3)
CO2: 27 mmol/L (ref 18–29)
Chloride: 98 mmol/L (ref 96–106)
Creatinine, Ser: 0.75 mg/dL (ref 0.57–1.00)
GFR, EST AFRICAN AMERICAN: 98 mL/min/{1.73_m2} (ref >59)
GFR, EST NON AFRICAN AMERICAN: 85 mL/min/{1.73_m2} (ref >59)
GLUCOSE: 97 mg/dL (ref 65–99)
Globulin, Total: 2.5 g/dL (ref 1.5–4.5)
Potassium: 3.9 mmol/L (ref 3.5–5.2)
Sodium: 138 mmol/L (ref 134–144)
TOTAL PROTEIN: 6.8 g/dL (ref 6.0–8.5)

## 2015-11-17 LAB — LIPID PANEL W/O CHOL/HDL RATIO
Cholesterol, Total: 135 mg/dL (ref 100–199)
HDL: 57 mg/dL (ref >39)
LDL Calculated: 62 mg/dL (ref 0–99)
TRIGLYCERIDES: 80 mg/dL (ref 0–149)
VLDL CHOLESTEROL CAL: 16 mg/dL (ref 5–40)

## 2015-11-17 LAB — TSH: TSH: 2.49 u[IU]/mL (ref 0.450–4.500)

## 2015-11-17 LAB — HGB A1C W/O EAG: HEMOGLOBIN A1C: 6.1 % — AB (ref 4.8–5.6)

## 2015-11-17 LAB — HEPATITIS C ANTIBODY

## 2015-11-17 LAB — HIV ANTIBODY (ROUTINE TESTING W REFLEX): HIV Screen 4th Generation wRfx: NONREACTIVE

## 2015-11-20 LAB — PAP LB AND HPV HIGH-RISK
HPV, high-risk: NEGATIVE
PAP Smear Comment: 0

## 2015-11-21 ENCOUNTER — Telehealth: Payer: Self-pay | Admitting: Family Medicine

## 2015-11-21 NOTE — Assessment & Plan Note (Signed)
Thin prep collected today 

## 2015-11-21 NOTE — Telephone Encounter (Signed)
Please call patient As I was finishing her note, I realized her blood pressure was high I really thought we rechecked her blood pressure, but I can't find it Please call her and let her know that her BP was high at our office and I'd like to have her check her own BP a few times a week over the next 2-3 weeks and to please call us with an update after the new year If the top number is staying higher than 150 mmHg, we'll increase her amlodipine, so please have her call us with those numbers

## 2015-11-21 NOTE — Assessment & Plan Note (Signed)
USPSTF grade A and B recommendations reviewed with patient; age-appropriate recommendations, preventive care, screening tests, etc discussed and encouraged; healthy living encouraged; see AVS for patient education given to patient  

## 2015-11-21 NOTE — Assessment & Plan Note (Signed)
Suspect element of impingement syndrome; will have her try "wall walking" and contact me if she desires PT or ortho referral

## 2015-11-21 NOTE — Assessment & Plan Note (Signed)
I thought patient's blood pressure was rechecked during her visit here, but I do not see one recorded as I type her note; weight loss was encouraged; DASH guidelines recommended; I will put in a phone note to staff to contact her and ask her to check her BP a few times a week for the next few weeks and call if not under [id number] systolic; continue ARB, thiazide, and CCB

## 2015-11-23 NOTE — Telephone Encounter (Signed)
I spoke with patient, she says that someone did recheck her BP and she thinks it was 129/72. She will check it at home and call back with an update as requested.

## 2015-12-15 ENCOUNTER — Ambulatory Visit
Admission: RE | Admit: 2015-12-15 | Discharge: 2015-12-15 | Disposition: A | Discharge status: Home / Self Care | Payer: 59 | Source: Ambulatory Visit | Attending: Family Medicine | Admitting: Family Medicine

## 2015-12-15 ENCOUNTER — Other Ambulatory Visit: Payer: Self-pay | Admitting: Family Medicine

## 2015-12-15 DIAGNOSIS — Z1231 Encounter for screening mammogram for malignant neoplasm of breast: Secondary | ICD-10-CM | POA: Diagnosis present

## 2016-01-14 ENCOUNTER — Other Ambulatory Visit: Payer: Self-pay | Admitting: Family Medicine

## 2016-01-14 NOTE — Telephone Encounter (Signed)
Last phone note reivewed; last K+ reviewed; Rx approved

## 2016-05-16 ENCOUNTER — Ambulatory Visit: Payer: 59 | Admitting: Family Medicine

## 2016-05-25 ENCOUNTER — Encounter: Payer: Self-pay | Admitting: Family Medicine

## 2016-05-25 ENCOUNTER — Ambulatory Visit (INDEPENDENT_AMBULATORY_CARE_PROVIDER_SITE_OTHER): Payer: 59 | Admitting: Family Medicine

## 2016-05-25 ENCOUNTER — Other Ambulatory Visit: Payer: Self-pay | Admitting: Family Medicine

## 2016-05-25 VITALS — BP 120/76 | HR 87 | Temp 98.8°F | Resp 16 | Wt 229.0 lb

## 2016-05-25 DIAGNOSIS — E669 Obesity, unspecified: Secondary | ICD-10-CM

## 2016-05-25 DIAGNOSIS — I10 Essential (primary) hypertension: Secondary | ICD-10-CM

## 2016-05-25 DIAGNOSIS — Z5181 Encounter for therapeutic drug level monitoring: Secondary | ICD-10-CM

## 2016-05-25 DIAGNOSIS — R7301 Impaired fasting glucose: Secondary | ICD-10-CM | POA: Diagnosis not present

## 2016-05-25 NOTE — Patient Instructions (Addendum)
Return on another day for labs (please come fasting) Okay to have water, black coffee, medicines, etc.  Your goal blood pressure is less than 150 mmHg on top. Try to follow the DASH guidelines (DASH stands for Dietary Approaches to Stop Hypertension) Try to limit the sodium in your diet.  Ideally, consume less than 1.5 grams (less than 1,500mg ) per day. Do not add salt when cooking or at the table.  Check the sodium amount on labels when shopping, and choose items lower in sodium when given a choice. Avoid or limit foods that already contain a lot of sodium. Eat a diet rich in fruits and vegetables and whole grains.  Check out the information at familydoctor.org entitled "Nutrition for Weight Loss: What You Need to Know about Fad Diets" Try to lose between 1-2 pounds per week by taking in fewer calories and burning off more calories You can succeed by limiting portions, limiting foods dense in calories and fat, becoming more active, and drinking 8 glasses of water a day (64 ounces) Don't skip meals, especially breakfast, as skipping meals may alter your metabolism Do not use over-the-counter weight loss pills or gimmicks that claim rapid weight loss A healthy BMI (or body mass index) is between 18.5 and 24.9 You can calculate your ideal BMI at the [place] website [employer]fr  Return in about 6-1/2 months for next visit and come fasting for labs then

## 2016-05-25 NOTE — Progress Notes (Signed)
BP 120/76 mmHg  Pulse 87  Temp(Src) 98.8 F (37.1 C) (Oral)  Resp 16  Wt 229 lb (103.874 kg)  SpO2 95%   Subjective:    Patient ID: Laura Kent, female    DOB: Jun 15, 1951, 65 y.o.   MRN: [id number]  HPI: Laura Kent is a 65 y.o. female  Chief Complaint  Patient presents with  . Follow-up    6 months   Patient is well-known to me from my previous practice  Prediabetes; everybody in the family has diabetes; no blurred vision; no dry mouth; no nocturia really, might get up at 5:30 am once; last A1c reviewed Lab Results  Component Value Date   HGBA1C 6.1* 11/16/2015   High blood pressure; checks BP away from here; 124/75 usually or around there; not much salt added to food; knows how to read labels for sodium; no decongestants; not much black licorice in a long time; nonsmoker  Gained a little weight; does walk a little; obesity has been an issue for years, up 10 pounds over the winter  Hx of hyperthryoidism, treated with radioactive iodine  Depression screen [facility] 2/9 05/25/2016 11/16/2015  Decreased Interest 0 0  Down, Depressed, Hopeless 0 0  PHQ - 2 Score 0 0   Relevant past medical, surgical, family and social history reviewed Past Medical History  Diagnosis Date  . Hypertension   . IFG (impaired fasting glucose)   . Reflux   . Menopausal syndrome   . Hyperthyroidism     Treated with radioactive iodine 2014  . Obesity    Past Surgical History  Procedure Laterality Date  . Cholecystectomy  Oct 2014  . Bunionectomy    . Breast cyst aspiration Right 2011   Family History  Problem Relation Age of Onset  . Diabetes Mother   . Kidney disease Mother   . Heart disease Mother   . Hypertension Mother   . Diabetes Father   . Heart disease Father   . Hypertension Father   . Diabetes Sister   . Cancer Brother     leukemia  . Heart disease Brother     tachycardia  . Stroke Brother     fatal  . Breast cancer Neg Hx    Social History  Substance Use  Topics  . Smoking status: Former Smoker    Quit date: 05/18/2012  . Smokeless tobacco: Never Used  . Alcohol Use: No   Interim medical history since last visit reviewed. Allergies and medications reviewed  Review of Systems Per HPI unless specifically indicated above     Objective:    BP 120/76 mmHg  Pulse 87  Temp(Src) 98.8 F (37.1 C) (Oral)  Resp 16  Wt 229 lb (103.874 kg)  SpO2 95%  Wt Readings from Last 3 Encounters:  05/25/16 229 lb (103.874 kg)  11/16/15 219 lb (99.338 kg)  06/02/15 219 lb (99.338 kg)   body mass index is 38.11 kg/(m^2).  Physical Exam  Constitutional: She appears well-developed and well-nourished. No distress.  Obese, weight up 10 pounds over last 6 months  HENT:  Head: Normocephalic and atraumatic.  Eyes: EOM are normal. No scleral icterus.  Neck: No JVD present. No thyromegaly present.  Cardiovascular: Normal rate, regular rhythm and normal heart sounds.   No murmur heard. Pulmonary/Chest: Effort normal and breath sounds normal. No respiratory distress. She has no wheezes.  Abdominal: Soft. She exhibits no distension.  Musculoskeletal: Normal range of motion. She exhibits no edema.  Neurological: She  is alert. She exhibits normal muscle tone.  Skin: Skin is warm and dry. She is not diaphoretic. No pallor.  Psychiatric: She has a normal mood and affect. Her behavior is normal. Judgment and thought content normal.  Vitals reviewed.  Results for orders placed or performed in visit on 11/16/15  TSH  Result Value Ref Range   TSH 2.490 0.450 - 4.500 uIU/mL  CBC with Differential/Platelet  Result Value Ref Range   WBC 5.8 3.4 - 10.8 x10E3/uL   RBC 5.05 3.77 - 5.28 x10E6/uL   Hemoglobin 14.6 11.1 - 15.9 g/dL   Hematocrit 43.4 34.0 - 46.6 %   MCV 86 79 - 97 fL   MCH 28.9 26.6 - 33.0 pg   MCHC 33.6 31.5 - 35.7 g/dL   RDW 14.9 12.3 - 15.4 %   Platelets 317 150 - 379 x10E3/uL   Neutrophils 54 %   Lymphs 39 %   Monocytes 6 %   Eos 1 %    Basos 0 %   Neutrophils Absolute 3.1 1.4 - 7.0 x10E3/uL   Lymphocytes Absolute 2.3 0.7 - 3.1 x10E3/uL   Monocytes Absolute 0.4 0.1 - 0.9 x10E3/uL   EOS (ABSOLUTE) 0.1 0.0 - 0.4 x10E3/uL   Basophils Absolute 0.0 0.0 - 0.2 x10E3/uL   Immature Granulocytes 0 %   Immature Grans (Abs) 0.0 0.0 - 0.1 x10E3/uL  Comprehensive metabolic panel  Result Value Ref Range   Glucose 97 65 - 99 mg/dL   BUN 14 8 - 27 mg/dL   Creatinine, Ser 0.75 0.57 - 1.00 mg/dL   GFR calc non Af Amer 85 >59 mL/min/1.73   GFR calc Af Amer 98 >59 mL/min/1.73   BUN/Creatinine Ratio 19 11 - 26   Sodium 138 134 - 144 mmol/L   Potassium 3.9 3.5 - 5.2 mmol/L   Chloride 98 96 - 106 mmol/L   CO2 27 18 - 29 mmol/L   Calcium 9.7 8.7 - 10.3 mg/dL   Total Protein 6.8 6.0 - 8.5 g/dL   Albumin 4.3 3.6 - 4.8 g/dL   Globulin, Total 2.5 1.5 - 4.5 g/dL   Albumin/Globulin Ratio 1.7 1.1 - 2.5   Bilirubin Total 0.5 0.0 - 1.2 mg/dL   Alkaline Phosphatase 78 39 - 117 IU/L   AST 14 0 - 40 IU/L   ALT 13 0 - 32 IU/L  Lipid Panel w/o Chol/HDL Ratio  Result Value Ref Range   Cholesterol, Total 135 100 - 199 mg/dL   Triglycerides 80 0 - 149 mg/dL   HDL 57 >39 mg/dL   VLDL Cholesterol Cal 16 5 - 40 mg/dL   LDL Calculated 62 0 - 99 mg/dL  Hgb A1c w/o eAG  Result Value Ref Range   Hgb A1c MFr Bld 6.1 (H) 4.8 - 5.6 %  Hepatitis C antibody  Result Value Ref Range   Hep C Virus Ab <0.1 0.0 - 0.9 s/co ratio  HIV antibody  Result Value Ref Range   HIV Screen 4th Generation wRfx Non Reactive Non Reactive  Pap liquid-based and HPV (high risk)  Result Value Ref Range   DIAGNOSIS: Comment    Specimen adequacy: Comment    CLINICIAN PROVIDED ICD10: Comment    Performed by: Comment    PAP SMEAR COMMENT .    Note: Comment    HPV, high-risk Negative Negative      Assessment & Plan:   Problem List Items Addressed This Visit      Cardiovascular and Mediastinum   Benign  hypertension - Primary    Monitor urine microalbumin:creatinine,  creatinine; try DASH guidelines        Endocrine   Impaired fasting glucose    Check A1c, fasting glucose      Relevant Orders   Hemoglobin A1c   Lipid Panel w/o Chol/HDL Ratio     Other   Obesity    Work on weight loss      Medication monitoring encounter   Relevant Orders   Comprehensive metabolic panel      Follow up plan: Return in about 7 months (around 12/12/2016).  An after-visit summary was printed and given to the patient at [place].  Please see the patient instructions which may contain other information and recommendations beyond what is mentioned above in the assessment and plan.  No orders of the defined types were placed in this encounter.    Orders Placed This Encounter  Procedures  . Hemoglobin A1c  . Comprehensive metabolic panel  . Lipid Panel w/o Chol/HDL Ratio

## 2016-05-25 NOTE — Assessment & Plan Note (Signed)
Monitor urine microalbumin:creatinine, creatinine; try DASH guidelines

## 2016-05-25 NOTE — Assessment & Plan Note (Signed)
Work on weight loss.

## 2016-05-25 NOTE — Assessment & Plan Note (Signed)
Check A1c, fasting glucose

## 2016-08-02 ENCOUNTER — Other Ambulatory Visit: Payer: Self-pay | Admitting: Family Medicine

## 2016-08-03 NOTE — Telephone Encounter (Signed)
Left voicemial 

## 2016-08-03 NOTE — Telephone Encounter (Signed)
I refilled patient's medicine, but please call her and gently remind her that she has outstanding labs from June that we'd like done to monitor her kidneys and other things Thank you

## 2016-08-23 ENCOUNTER — Encounter: Payer: Self-pay | Admitting: Family Medicine

## 2016-08-23 LAB — COMPREHENSIVE METABOLIC PANEL
A/G RATIO: 1.7 (ref 1.2–2.2)
ALT: 13 IU/L (ref 0–32)
AST: 12 IU/L (ref 0–40)
Albumin: 4.2 g/dL (ref 3.6–4.8)
Alkaline Phosphatase: 88 IU/L (ref 39–117)
BUN/Creatinine Ratio: 22 (ref 12–28)
BUN: 18 mg/dL (ref 8–27)
Bilirubin Total: 0.3 mg/dL (ref 0.0–1.2)
CALCIUM: 10.1 mg/dL (ref 8.7–10.3)
CO2: 26 mmol/L (ref 18–29)
CREATININE: 0.81 mg/dL (ref 0.57–1.00)
Chloride: 100 mmol/L (ref 96–106)
GFR, EST AFRICAN AMERICAN: 89 mL/min/{1.73_m2} (ref >59)
GFR, EST NON AFRICAN AMERICAN: 78 mL/min/{1.73_m2} (ref >59)
Globulin, Total: 2.5 g/dL (ref 1.5–4.5)
Glucose: 98 mg/dL (ref 65–99)
Potassium: 3.9 mmol/L (ref 3.5–5.2)
SODIUM: 143 mmol/L (ref 134–144)
TOTAL PROTEIN: 6.7 g/dL (ref 6.0–8.5)

## 2016-08-23 LAB — HEMOGLOBIN A1C
Est. average glucose Bld gHb Est-mCnc: 131 mg/dL
Hgb A1c MFr Bld: 6.2 % — ABNORMAL HIGH (ref 4.8–5.6)

## 2016-08-23 LAB — LIPID PANEL W/O CHOL/HDL RATIO
Cholesterol, Total: 121 mg/dL (ref 100–199)
HDL: 44 mg/dL (ref >39)
LDL CALC: 47 mg/dL (ref 0–99)
Triglycerides: 150 mg/dL — ABNORMAL HIGH (ref 0–149)
VLDL Cholesterol Cal: 30 mg/dL (ref 5–40)

## 2016-08-23 NOTE — Progress Notes (Signed)
Letter re: labs 

## 2016-10-22 ENCOUNTER — Other Ambulatory Visit: Payer: Self-pay | Admitting: Family Medicine

## 2016-10-24 NOTE — Telephone Encounter (Signed)
Last labs reviewed; approved

## 2016-11-14 ENCOUNTER — Other Ambulatory Visit: Payer: Self-pay | Admitting: Family Medicine

## 2016-11-14 DIAGNOSIS — Z1231 Encounter for screening mammogram for malignant neoplasm of breast: Secondary | ICD-10-CM

## 2016-11-17 ENCOUNTER — Encounter: Payer: 59 | Admitting: Family Medicine

## 2016-11-21 ENCOUNTER — Encounter: Payer: Self-pay | Admitting: General Surgery

## 2016-11-21 ENCOUNTER — Ambulatory Visit (INDEPENDENT_AMBULATORY_CARE_PROVIDER_SITE_OTHER): Payer: 59 | Admitting: Family Medicine

## 2016-11-21 ENCOUNTER — Encounter: Payer: Self-pay | Admitting: Family Medicine

## 2016-11-21 VITALS — BP 136/84 | HR 93 | Temp 98.8°F | Resp 14 | Ht 65.0 in | Wt 226.0 lb

## 2016-11-21 DIAGNOSIS — I10 Essential (primary) hypertension: Secondary | ICD-10-CM

## 2016-11-21 DIAGNOSIS — R7301 Impaired fasting glucose: Secondary | ICD-10-CM

## 2016-11-21 DIAGNOSIS — E6609 Other obesity due to excess calories: Secondary | ICD-10-CM

## 2016-11-21 DIAGNOSIS — Z6837 Body mass index (BMI) 37.0-37.9, adult: Secondary | ICD-10-CM | POA: Diagnosis not present

## 2016-11-21 DIAGNOSIS — D489 Neoplasm of uncertain behavior, unspecified: Secondary | ICD-10-CM | POA: Diagnosis not present

## 2016-11-21 DIAGNOSIS — Z1211 Encounter for screening for malignant neoplasm of colon: Secondary | ICD-10-CM | POA: Insufficient documentation

## 2016-11-21 DIAGNOSIS — Z0001 Encounter for general adult medical examination with abnormal findings: Secondary | ICD-10-CM | POA: Diagnosis not present

## 2016-11-21 DIAGNOSIS — Z0000 Encounter for general adult medical examination without abnormal findings: Secondary | ICD-10-CM

## 2016-11-21 DIAGNOSIS — D485 Neoplasm of uncertain behavior of skin: Secondary | ICD-10-CM | POA: Insufficient documentation

## 2016-11-21 HISTORY — DX: Neoplasm of uncertain behavior of skin: D48.5

## 2016-11-21 NOTE — Progress Notes (Signed)
BP 136/84   Pulse 93   Temp 98.8 F (37.1 C) (Oral)   Resp 14   Ht 5\' 5"  (1.651 m)   Wt 226 lb (102.5 kg)   SpO2 93%   BMI 37.61 kg/m    Subjective:    Patient ID: Laura Kent, female    DOB: [birth year], 65 y.o.   MRN: [id number]  HPI: Laura Kent is a 65 y.o. female  Chief Complaint  Patient presents with  . Annual Exam   USPSTF grade A and B recommendations Alcohol: no Depression:  Depression screen [facility] 2/9 11/21/2016 05/25/2016 11/16/2015  Decreased Interest 0 0 0  Down, Depressed, Hopeless 0 0 0  PHQ - 2 Score 0 0 0   Hypertension: controlled Obesity: patient declined help; she will do it herself Tobacco use:  HIV, hep B, hep C: already done STD testing and prevention (chl/gon/syphilis): decliend Lipids: check Glucose: check Colorectal cancer: ordered Breast cancer: UTD< already scheduled for 3D Lung cancer: less than 30 pack years Osteoporosis: no fam hx; smoker; she'll wait until 75 AAA: n/a Aspirin: taking aspirin Diet: good eater Exercise: walks some, she was recommended to gradually increase Skin cancer: no worrisome moles; had a bee sting in back of thigh, then shooting pain; very focal; no changing with movement; always in the same spot; shoot; not from the back; no mass in the thigh; just the left thigh  She has prediabetes; will work on weight loss on her own She has HTN; controlled today; weight loss plan as noted; she's taking ARB-thiazide  Depression screen [facility] 2/9 11/21/2016 05/25/2016 11/16/2015  Decreased Interest 0 0 0  Down, Depressed, Hopeless 0 0 0  PHQ - 2 Score 0 0 0   Relevant past medical, surgical, family and social history reviewed Past Medical History:  Diagnosis Date  . Hypertension   . Hyperthyroidism    Treated with radioactive iodine 2014  . IFG (impaired fasting glucose)   . Menopausal syndrome   . Obesity   . Reflux    Past Surgical History:  Procedure Laterality Date  . BREAST CYST ASPIRATION Right  2011  . BUNIONECTOMY    . CHOLECYSTECTOMY  Oct 2014   Family History  Problem Relation Age of Onset  . Diabetes Mother   . Kidney disease Mother   . Heart disease Mother   . Hypertension Mother   . Diabetes Father   . Heart disease Father   . Hypertension Father   . Diabetes Sister   . Cancer Brother     leukemia  . Heart disease Brother     tachycardia  . Stroke Brother     fatal  . Breast cancer Neg Hx    Social History  Substance Use Topics  . Smoking status: Current Every Day Smoker    Last attempt to quit: 05/18/2012  . Smokeless tobacco: Never Used  . Alcohol use No   Interim medical history since last visit reviewed. Allergies and medications reviewed  Review of Systems  Constitutional: Negative for unexpected weight change.  HENT: Positive for hearing loss (sometimes reduced hearing if room crowded).   Eyes: Negative for visual disturbance.  Respiratory: Negative for shortness of breath.   Cardiovascular: Negative for chest pain and leg swelling.  Gastrointestinal: Negative for blood in stool.  Genitourinary: Negative for hematuria.  Neurological: Negative for tremors.  Hematological: Does not bruise/bleed easily.  Per HPI unless specifically indicated above     Objective:    BP  136/84   Pulse 93   Temp 98.8 F (37.1 C) (Oral)   Resp 14   Ht 5\' 5"  (1.651 m)   Wt 226 lb (102.5 kg)   SpO2 93%   BMI 37.61 kg/m   Wt Readings from Last 3 Encounters:  11/21/16 226 lb (102.5 kg)  05/25/16 229 lb (103.9 kg)  11/16/15 219 lb (99.3 kg)    Physical Exam  Constitutional: She appears well-developed and well-nourished.  HENT:  Head: Normocephalic and atraumatic.  Right Ear: Hearing, tympanic membrane, external ear and ear canal normal.  Left Ear: Hearing, tympanic membrane, external ear and ear canal normal.  Eyes: Conjunctivae and EOM are normal. Right eye exhibits no hordeolum. Left eye exhibits no hordeolum. No scleral icterus.  Neck: Carotid bruit is  not present. No thyromegaly present.  Cardiovascular: Normal rate, regular rhythm, S1 normal, S2 normal and normal heart sounds.   No extrasystoles are present.  Pulmonary/Chest: Effort normal and breath sounds normal. No respiratory distress. Right breast exhibits no inverted nipple, no mass, no nipple discharge, no skin change and no tenderness. Left breast exhibits no inverted nipple, no mass, no nipple discharge, no skin change and no tenderness. Breasts are symmetrical.  Abdominal: Soft. Normal appearance and bowel sounds are normal. She exhibits no distension, no abdominal bruit, no pulsatile midline mass and no mass. There is no hepatosplenomegaly. There is no tenderness. No hernia.  Musculoskeletal: Normal range of motion. She exhibits no edema.  Lymphadenopathy:       Head (right side): No submandibular adenopathy present.       Head (left side): No submandibular adenopathy present.    She has no cervical adenopathy.    She has no axillary adenopathy.  Neurological: She is alert. She displays no tremor. No cranial nerve deficit. She exhibits normal muscle tone. Gait normal.  Reflex Scores:      Patellar reflexes are 2+ on the right side and 2+ on the left side. Skin: Skin is warm and dry. No bruising and no ecchymosis noted. No cyanosis. No pallor.  Irregular dark macular nevus upper left thigh  Psychiatric: Her speech is normal and behavior is normal. Thought content normal. Her mood appears not anxious. She does not exhibit a depressed mood.      Assessment & Plan:   Problem List Items Addressed This Visit      Cardiovascular and Mediastinum   Benign hypertension    Continue meds; work on weight; follow DASH guidelines        Endocrine   Impaired fasting glucose    Check A1c; work on weight loss      Relevant Orders   Hemoglobin A1c (Completed)     Other   Preventative health care - Primary    USPSTF grade A and B recommendations reviewed with patient; age-appropriate  recommendations, preventive care, screening tests, etc discussed and encouraged; healthy living encouraged; see AVS for patient education given to patient      Relevant Orders   CBC with Differential/Platelet (Completed)   Lipid panel (Completed)   Comprehensive metabolic panel (Completed)   TSH (Completed)   Obesity    Patient wishes to work on weight loss on her own; declined offer for assistance; weight loss should help BP and lipids      Colon cancer screening    Refer to GI      Relevant Orders   Ambulatory referral to Gastroenterology    Other Visit Diagnoses    Neoplasm of uncertain  behavior       Relevant Orders   Ambulatory referral to Dermatology      Follow up plan: Return in about 1 year (around 11/21/2017) for Welcome to Medicare visit 40 minutes.  An after-visit summary was printed and given to the patient at [place].  Please see the patient instructions which may contain other information and recommendations beyond what is mentioned above in the assessment and plan.  No orders of the defined types were placed in this encounter.   Orders Placed This Encounter  Procedures  . CBC with Differential/Platelet  . Lipid panel  . Comprehensive metabolic panel  . Hemoglobin A1c  . TSH  . Ambulatory referral to Gastroenterology  . Ambulatory referral to Dermatology

## 2016-11-21 NOTE — Assessment & Plan Note (Signed)
USPSTF grade A and B recommendations reviewed with patient; age-appropriate recommendations, preventive care, screening tests, etc discussed and encouraged; healthy living encouraged; see AVS for patient education given to patient  

## 2016-11-21 NOTE — Assessment & Plan Note (Signed)
Refer to GI 

## 2016-11-21 NOTE — Assessment & Plan Note (Signed)
Continue meds; work on weight; follow DASH guidelines

## 2016-11-21 NOTE — Patient Instructions (Addendum)
I do encourage you to quit smoking Call [phone] to sign up for smoking cessation classes You can call 1-800-QUIT-NOW to talk with a smoking cessation coach  Health Maintenance, Female Introduction Adopting a healthy lifestyle and getting preventive care can go a long way to promote health and wellness. Talk with your health care provider about what schedule of regular examinations is right for you. This is a good chance for you to check in with your provider about disease prevention and staying healthy. In between checkups, there are plenty of things you can do on your own. Experts have done a lot of research about which lifestyle changes and preventive measures are most likely to keep you healthy. Ask your health care provider for more information. Weight and diet Eat a healthy diet  Be sure to include plenty of vegetables, fruits, low-fat dairy products, and lean protein.  Do not eat a lot of foods high in solid fats, added sugars, or salt.  Get regular exercise. This is one of the most important things you can do for your health.  Most adults should exercise for at least 150 minutes each week. The exercise should increase your heart rate and make you sweat (moderate-intensity exercise).  Most adults should also do strengthening exercises at least twice a week. This is in addition to the moderate-intensity exercise. Maintain a healthy weight  Body mass index (BMI) is a measurement that can be used to identify possible weight problems. It estimates body fat based on height and weight. Your health care provider can help determine your BMI and help you achieve or maintain a healthy weight.  For females 84 years of age and older:  A BMI below 18.5 is considered underweight.  A BMI of 18.5 to 24.9 is normal.  A BMI of 25 to 29.9 is considered overweight.  A BMI of 30 and above is considered obese. Watch levels of cholesterol and blood lipids  You should start having your blood  tested for lipids and cholesterol at 64 years of age, then have this test every 5 years.  You may need to have your cholesterol levels checked more often if:  Your lipid or cholesterol levels are high.  You are older than 64 years of age.  You are at high risk for heart disease. Cancer screening Lung Cancer  Lung cancer screening is recommended for adults 41-45 years old who are at high risk for lung cancer because of a history of smoking.  A yearly low-dose CT scan of the lungs is recommended for people who:  Currently smoke.  Have quit within the past 15 years.  Have at least a 30-pack-year history of smoking. A pack year is smoking an average of one pack of cigarettes a day for 1 year.  Yearly screening should continue until it has been 15 years since you quit.  Yearly screening should stop if you develop a health problem that would prevent you from having lung cancer treatment. Breast Cancer  Practice breast self-awareness. This means understanding how your breasts normally appear and feel.  It also means doing regular breast self-exams. Let your health care provider know about any changes, no matter how small.  If you are in your 20s or 30s, you should have a clinical breast exam (CBE) by a health care provider every 1-3 years as part of a regular health exam.  If you are 35 or older, have a CBE every year. Also consider having a breast X-ray (mammogram) every year.  If you have a family history of breast cancer, talk to your health care provider about genetic screening.  If you are at high risk for breast cancer, talk to your health care provider about having an MRI and a mammogram every year.  Breast cancer gene (BRCA) assessment is recommended for women who have family members with BRCA-related cancers. BRCA-related cancers include:  Breast.  Ovarian.  Tubal.  Peritoneal cancers.  Results of the assessment will determine the need for genetic counseling and  BRCA1 and BRCA2 testing. Cervical Cancer  Your health care provider may recommend that you be screened regularly for cancer of the pelvic organs (ovaries, uterus, and vagina). This screening involves a pelvic examination, including checking for microscopic changes to the surface of your cervix (Pap test). You may be encouraged to have this screening done every 3 years, beginning at age 32.  For women ages 28-65, health care providers may recommend pelvic exams and Pap testing every 3 years, or they may recommend the Pap and pelvic exam, combined with testing for human papilloma virus (HPV), every 5 years. Some types of HPV increase your risk of cervical cancer. Testing for HPV may also be done on women of any age with unclear Pap test results.  Other health care providers may not recommend any screening for nonpregnant women who are considered low risk for pelvic cancer and who do not have symptoms. Ask your health care provider if a screening pelvic exam is right for you.  If you have had past treatment for cervical cancer or a condition that could lead to cancer, you need Pap tests and screening for cancer for at least 20 years after your treatment. If Pap tests have been discontinued, your risk factors (such as having a new sexual partner) need to be reassessed to determine if screening should resume. Some women have medical problems that increase the chance of getting cervical cancer. In these cases, your health care provider may recommend more frequent screening and Pap tests. Colorectal Cancer  This type of cancer can be detected and often prevented.  Routine colorectal cancer screening usually begins at 64 years of age and continues through 64 years of age.  Your health care provider may recommend screening at an earlier age if you have risk factors for colon cancer.  Your health care provider may also recommend using home test kits to check for hidden blood in the stool.  A small camera at  the end of a tube can be used to examine your colon directly (sigmoidoscopy or colonoscopy). This is done to check for the earliest forms of colorectal cancer.  Routine screening usually begins at age 35.  Direct examination of the colon should be repeated every 5-10 years through 64 years of age. However, you may need to be screened more often if early forms of precancerous polyps or small growths are found. Skin Cancer  Check your skin from head to toe regularly.  Tell your health care provider about any new moles or changes in moles, especially if there is a change in a mole's shape or color.  Also tell your health care provider if you have a mole that is larger than the size of a pencil eraser.  Always use sunscreen. Apply sunscreen liberally and repeatedly throughout the day.  Protect yourself by wearing long sleeves, pants, a wide-brimmed hat, and sunglasses whenever you are outside. Heart disease, diabetes, and high blood pressure  High blood pressure causes heart disease and increases the risk  of stroke. High blood pressure is more likely to develop in:  People who have blood pressure in the high end of the normal range (130-139/85-89 mm Hg).  People who are overweight or obese.  People who are African American.  If you are 6-59 years of age, have your blood pressure checked every 3-5 years. If you are 60 years of age or older, have your blood pressure checked every year. You should have your blood pressure measured twice-once when you are at a hospital or clinic, and once when you are not at a hospital or clinic. Record the average of the two measurements. To check your blood pressure when you are not at a hospital or clinic, you can use:  An automated blood pressure machine at a pharmacy.  A home blood pressure monitor.  If you are between 91 years and 78 years old, ask your health care provider if you should take aspirin to prevent strokes.  Have regular diabetes  screenings. This involves taking a blood sample to check your fasting blood sugar level.  If you are at a normal weight and have a low risk for diabetes, have this test once every three years after 64 years of age.  If you are overweight and have a high risk for diabetes, consider being tested at a younger age or more often. Preventing infection Hepatitis B  If you have a higher risk for hepatitis B, you should be screened for this virus. You are considered at high risk for hepatitis B if:  You were born in a country where hepatitis B is common. Ask your health care provider which countries are considered high risk.  Your parents were born in a high-risk country, and you have not been immunized against hepatitis B (hepatitis B vaccine).  You have HIV or AIDS.  You use needles to inject street drugs.  You live with someone who has hepatitis B.  You have had sex with someone who has hepatitis B.  You get hemodialysis treatment.  You take certain medicines for conditions, including cancer, organ transplantation, and autoimmune conditions. Hepatitis C  Blood testing is recommended for:  Everyone born from [birth year] through 1964.  Anyone with known risk factors for hepatitis C. Sexually transmitted infections (STIs)  You should be screened for sexually transmitted infections (STIs) including gonorrhea and chlamydia if:  You are sexually active and are younger than 63 years of age.  You are older than 64 years of age and your health care provider tells you that you are at risk for this type of infection.  Your sexual activity has changed since you were last screened and you are at an increased risk for chlamydia or gonorrhea. Ask your health care provider if you are at risk.  If you do not have HIV, but are at risk, it may be recommended that you take a prescription medicine daily to prevent HIV infection. This is called pre-exposure prophylaxis (PrEP). You are considered at risk  if:  You are sexually active and do not regularly use condoms or know the HIV status of your partner(s).  You take drugs by injection.  You are sexually active with a partner who has HIV. Talk with your health care provider about whether you are at high risk of being infected with HIV. If you choose to begin PrEP, you should first be tested for HIV. You should then be tested every 3 months for as long as you are taking PrEP. Pregnancy  If you are  premenopausal and you may become pregnant, ask your health care provider about preconception counseling.  If you may become pregnant, take 400 to 800 micrograms (mcg) of folic acid every day.  If you want to prevent pregnancy, talk to your health care provider about birth control (contraception). Osteoporosis and menopause  Osteoporosis is a disease in which the bones lose minerals and strength with aging. This can result in serious bone fractures. Your risk for osteoporosis can be identified using a bone density scan.  If you are 37 years of age or older, or if you are at risk for osteoporosis and fractures, ask your health care provider if you should be screened.  Ask your health care provider whether you should take a calcium or vitamin D supplement to lower your risk for osteoporosis.  Menopause may have certain physical symptoms and risks.  Hormone replacement therapy may reduce some of these symptoms and risks. Talk to your health care provider about whether hormone replacement therapy is right for you. Follow these instructions at home:  Schedule regular health, dental, and eye exams.  Stay current with your immunizations.  Do not use any tobacco products including cigarettes, chewing tobacco, or electronic cigarettes.  If you are pregnant, do not drink alcohol.  If you are breastfeeding, limit how much and how often you drink alcohol.  Limit alcohol intake to no more than 1 drink per day for nonpregnant women. One drink equals  12 ounces of beer, 5 ounces of wine, or 1 ounces of hard liquor.  Do not use street drugs.  Do not share needles.  Ask your health care provider for help if you need support or information about quitting drugs.  Tell your health care provider if you often feel depressed.  Tell your health care provider if you have ever been abused or do not feel safe at home. This information is not intended to replace advice given to you by your health care provider. Make sure you discuss any questions you have with your health care provider. Document Released: 06/06/2011 Document Revised: 04/28/2016 Document Reviewed: 08/25/2015  2017 Elsevier

## 2016-11-21 NOTE — Assessment & Plan Note (Signed)
Check A1c; work on weight loss 

## 2016-11-21 NOTE — Assessment & Plan Note (Signed)
Posterior left buttock with dark palpable area medially; refer to dermatologist

## 2016-11-22 LAB — CBC WITH DIFFERENTIAL/PLATELET
BASOS ABS: 0 10*3/uL (ref 0.0–0.2)
BASOS: 1 %
EOS (ABSOLUTE): 0 10*3/uL (ref 0.0–0.4)
Eos: 1 %
Hematocrit: 43.9 % (ref 34.0–46.6)
Hemoglobin: 14.5 g/dL (ref 11.1–15.9)
Immature Grans (Abs): 0 10*3/uL (ref 0.0–0.1)
Immature Granulocytes: 0 %
LYMPHS ABS: 2.2 10*3/uL (ref 0.7–3.1)
LYMPHS: 40 %
MCH: 28.4 pg (ref 26.6–33.0)
MCHC: 33 g/dL (ref 31.5–35.7)
MCV: 86 fL (ref 79–97)
MONOS ABS: 0.5 10*3/uL (ref 0.1–0.9)
Monocytes: 9 %
NEUTROS ABS: 2.7 10*3/uL (ref 1.4–7.0)
Neutrophils: 49 %
PLATELETS: 352 10*3/uL (ref 150–379)
RBC: 5.11 x10E6/uL (ref 3.77–5.28)
RDW: 14.2 % (ref 12.3–15.4)
WBC: 5.5 10*3/uL (ref 3.4–10.8)

## 2016-11-22 LAB — COMPREHENSIVE METABOLIC PANEL
ALBUMIN: 4.4 g/dL (ref 3.6–4.8)
ALK PHOS: 86 IU/L (ref 39–117)
ALT: 15 IU/L (ref 0–32)
AST: 17 IU/L (ref 0–40)
Albumin/Globulin Ratio: 1.8 (ref 1.2–2.2)
BUN / CREAT RATIO: 15 (ref 12–28)
BUN: 12 mg/dL (ref 8–27)
Bilirubin Total: 0.5 mg/dL (ref 0.0–1.2)
CALCIUM: 9.5 mg/dL (ref 8.7–10.3)
CO2: 26 mmol/L (ref 18–29)
CREATININE: 0.78 mg/dL (ref 0.57–1.00)
Chloride: 99 mmol/L (ref 96–106)
GFR calc Af Amer: 93 mL/min/{1.73_m2} (ref >59)
GFR, EST NON AFRICAN AMERICAN: 81 mL/min/{1.73_m2} (ref >59)
GLOBULIN, TOTAL: 2.4 g/dL (ref 1.5–4.5)
GLUCOSE: 104 mg/dL — AB (ref 65–99)
Potassium: 4 mmol/L (ref 3.5–5.2)
SODIUM: 142 mmol/L (ref 134–144)
Total Protein: 6.8 g/dL (ref 6.0–8.5)

## 2016-11-22 LAB — HEMOGLOBIN A1C
ESTIMATED AVERAGE GLUCOSE: 131 mg/dL
HEMOGLOBIN A1C: 6.2 % — AB (ref 4.8–5.6)

## 2016-11-22 LAB — LIPID PANEL
CHOLESTEROL TOTAL: 122 mg/dL (ref 100–199)
Chol/HDL Ratio: 2.4 ratio units (ref 0.0–4.4)
HDL: 50 mg/dL (ref >39)
LDL Calculated: 56 mg/dL (ref 0–99)
Triglycerides: 79 mg/dL (ref 0–149)
VLDL CHOLESTEROL CAL: 16 mg/dL (ref 5–40)

## 2016-11-22 LAB — TSH: TSH: 2.3 u[IU]/mL (ref 0.450–4.500)

## 2016-12-02 NOTE — Assessment & Plan Note (Signed)
Patient wishes to work on weight loss on her own; declined offer for assistance; weight loss should help BP and lipids

## 2016-12-13 ENCOUNTER — Ambulatory Visit: Payer: 59 | Admitting: Family Medicine

## 2016-12-22 ENCOUNTER — Ambulatory Visit: Payer: 59

## 2016-12-23 ENCOUNTER — Other Ambulatory Visit: Payer: Self-pay | Admitting: Family Medicine

## 2016-12-24 NOTE — Telephone Encounter (Signed)
Dec 2017 labs reviewed; Rxs approved

## 2016-12-26 ENCOUNTER — Encounter: Payer: Self-pay | Admitting: General Surgery

## 2016-12-26 ENCOUNTER — Ambulatory Visit (INDEPENDENT_AMBULATORY_CARE_PROVIDER_SITE_OTHER): Payer: 59 | Admitting: General Surgery

## 2016-12-26 VITALS — BP 130/74 | HR 78 | Resp 12 | Ht 65.0 in | Wt 229.0 lb

## 2016-12-26 DIAGNOSIS — Z1211 Encounter for screening for malignant neoplasm of colon: Secondary | ICD-10-CM | POA: Diagnosis not present

## 2016-12-26 MED ORDER — POLYETHYLENE GLYCOL 3350 17 GM/SCOOP PO POWD: ORAL | 0 refills | Status: DC

## 2016-12-26 NOTE — Patient Instructions (Signed)
Colonoscopy, Adult A colonoscopy is an exam to look at the entire large intestine. During the exam, a lubricated, bendable tube is inserted into the anus and then passed into the rectum, colon, and other parts of the large intestine. A colonoscopy is often done as a part of normal colorectal screening or in response to certain symptoms, such as anemia, persistent diarrhea, abdominal pain, and blood in the stool. The exam can help screen for and diagnose medical problems, including:  Tumors.  Polyps.  Inflammation.  Areas of bleeding. Tell a health care provider about:  Any allergies you have.  All medicines you are taking, including vitamins, herbs, eye drops, creams, and over-the-counter medicines.  Any problems you or family members have had with anesthetic medicines.  Any blood disorders you have.  Any surgeries you have had.  Any medical conditions you have.  Any problems you have had passing stool. What are the risks? Generally, this is a safe procedure. However, problems may occur, including:  Bleeding.  A tear in the intestine.  A reaction to medicines given during the exam.  Infection (rare). What happens before the procedure? Eating and drinking restrictions  Follow instructions from your health care provider about eating and drinking, which may include:  A few days before the procedure - follow a low-fiber diet. Avoid nuts, seeds, dried fruit, raw fruits, and vegetables.  1-3 days before the procedure - follow a clear liquid diet. Drink only clear liquids, such as clear broth or bouillon, black coffee or tea, clear juice, clear soft drinks or sports drinks, gelatin desert, and popsicles. Avoid any liquids that contain red or purple dye.  On the day of the procedure - do not eat or drink anything during the 2 hours before the procedure, or within the time period that your health care provider recommends. Bowel prep  If you were prescribed an oral bowel prep to  clean out your colon:  Take it as told by your health care provider. Starting the day before your procedure, you will need to drink a large amount of medicated liquid. The liquid will cause you to have multiple loose stools until your stool is almost clear or light green.  If your skin or anus gets irritated from diarrhea, you may use these to relieve the irritation:  Medicated wipes, such as adult wet wipes with aloe and vitamin E.  A skin soothing-product like petroleum jelly.  If you vomit while drinking the bowel prep, take a break for up to 60 minutes and then begin the bowel prep again. If vomiting continues and you cannot take the bowel prep without vomiting, call your health care provider. General instructions  Ask your health care provider about changing or stopping your regular medicines. This is especially important if you are taking diabetes medicines or blood thinners.  Plan to have someone take you home from the hospital or clinic. What happens during the procedure?  An IV tube may be inserted into one of your veins.  You will be given medicine to help you relax (sedative).  To reduce your risk of infection:  Your health care team will wash or sanitize their hands.  Your anal area will be washed with soap.  You will be asked to lie on your side with your knees bent.  Your health care provider will lubricate a long, thin, flexible tube. The tube will have a camera and a light on the end.  The tube will be inserted into your anus.  The tube will be gently eased through your rectum and colon.  Air will be delivered into your colon to keep it open. You may feel some pressure or cramping.  The camera will be used to take images during the procedure.  A small tissue sample may be removed from your body to be examined under a microscope (biopsy). If any potential problems are found, the tissue will be sent to a lab for testing.  If small polyps are found, your health  care provider may remove them and have them checked for cancer cells.  The tube that was inserted into your anus will be slowly removed. The procedure may vary among health care providers and hospitals. What happens after the procedure?  Your blood pressure, heart rate, breathing rate, and blood oxygen level will be monitored until the medicines you were given have worn off.  Do not drive for 24 hours after the exam.  You may have a small amount of blood in your stool.  You may pass gas and have mild abdominal cramping or bloating due to the air that was used to inflate your colon during the exam.  It is up to you to get the results of your procedure. Ask your health care provider, or the department performing the procedure, when your results will be ready. This information is not intended to replace advice given to you by your health care provider. Make sure you discuss any questions you have with your health care provider. Document Released: 11/18/2000 Document Revised: 06/10/2016 Document Reviewed: 02/02/2016 Elsevier Interactive Patient Education  2017 [employer]  

## 2016-12-26 NOTE — Progress Notes (Signed)
Patient ID: Laura Kent, female   DOB: [birth year], 66 y.o.   MRN: [id number]  Chief Complaint  Patient presents with  . Colonoscopy    HPI Laura Kent is a 66 y.o. female Here today for a evaluation of a screening colonoscopy. Last colonoscopy was on  08/30/2003.Moves her bowels every three days.   Since her colonoscopy in 2014 she does have episodes of urgency related to meals. She has not clearly associated what she has eaten with the urge to defecate.       HPI  Past Medical History:  Diagnosis Date  . Hypertension   . Hyperthyroidism    Treated with radioactive iodine 2014  . IFG (impaired fasting glucose)   . Menopausal syndrome   . Obesity   . Reflux     Past Surgical History:  Procedure Laterality Date  . BREAST CYST ASPIRATION Right 2011  . BUNIONECTOMY    . CHOLECYSTECTOMY  Oct 2014    Family History  Problem Relation Age of Onset  . Diabetes Mother   . Kidney disease Mother   . Heart disease Mother   . Hypertension Mother   . Diabetes Father   . Heart disease Father   . Hypertension Father   . Cancer Brother     leukemia  . Heart disease Brother     tachycardia  . Stroke Brother     fatal  . Diabetes Sister   . Colon cancer Maternal Aunt   . Breast cancer Neg Hx     Social History Social History  Substance Use Topics  . Smoking status: Current Every Day Smoker    Last attempt to quit: 05/18/2012  . Smokeless tobacco: Never Used  . Alcohol use No    No Known Allergies  Current Outpatient Prescriptions  Medication Sig Dispense Refill  . amLODipine (NORVASC) 2.5 MG tablet TAKE 1 TABLET BY MOUTH  DAILY 90 tablet 3  . aspirin EC 81 MG tablet Take 81 mg by mouth daily.    . hydrocortisone 2.5 % cream     . potassium chloride SA (K-DUR,KLOR-CON) 20 MEQ tablet TAKE 1 TABLET BY MOUTH  DAILY 90 tablet 1  . valsartan-hydrochlorothiazide (DIOVAN-HCT) 160-25 MG tablet TAKE 1 TABLET BY MOUTH  DAILY 90 tablet 3  . polyethylene glycol powder  (GLYCOLAX/MIRALAX) powder 255 grams one bottle for colonoscopy prep 255 g 0   No current facility-administered medications for this visit.     Review of Systems Review of Systems  Constitutional: Negative.   Respiratory: Negative.   Cardiovascular: Negative.     Blood pressure 130/74, pulse 78, resp. rate 12, height 5\' 5"  (1.651 m), weight 229 lb (103.9 kg).  Physical Exam Physical Exam  Constitutional: She is oriented to person, place, and time. She appears well-developed and well-nourished.  Eyes: Conjunctivae are normal. No scleral icterus.  Neck: Neck supple.  Cardiovascular: Normal rate, regular rhythm and normal heart sounds.   Pulmonary/Chest: Effort normal and breath sounds normal.  Lymphadenopathy:    She has no cervical adenopathy.  Neurological: She is alert and oriented to person, place, and time.  Skin: Skin is warm and dry.    Data Reviewed 11/21/2016 CBC showed a hemoglobin of 14.5 with MCV of 86, white blood cell count 5500, platelet count 3 52,000. Comprehensive metabolic panel of the same date showed a normal renal function with an estimated GFR of 93, creatinine 0.78, scant elevation of the fasting blood sugar 104, elevated hemoglobin A1c at 6.2  Assessment    Candidate for colon cancer screening.    Plan    Options for screening reviewed: Colonoscopy versus Cologuard. The patient has a second-degree relative (aunt) who had colon cancer. This would exclude her from Cologuard testing. If this was positive, colonoscopy would be the next up.  At this time the patient desires to proceed to colonoscopy.    Colonoscopy with possible biopsy/polypectomy prn: Information regarding the procedure, including its potential risks and complications (including but not limited to perforation of the bowel, which may require emergency surgery to repair, and bleeding) was verbally given to the patient. Educational information regarding lower intestinal endoscopy was given  to the patient. Written instructions for how to complete the bowel prep using Miralax were provided. The importance of drinking ample fluids to avoid dehydration as a result of the prep emphasized.  Patient has been scheduled for a colonoscopy on 01-17-17 at [facility]. It is okay for patient to continue 81 mg aspirin. Patient will only take blood pressure medication the morning of procedure at 6 am with a small sip of water.   This information has been scribed by Gaspar Cola CMA.   Robert Bellow 12/26/2016, 4:05 PM

## 2017-01-03 ENCOUNTER — Ambulatory Visit
Admission: RE | Admit: 2017-01-03 | Discharge: 2017-01-03 | Disposition: A | Discharge status: Home / Self Care | Payer: 59 | Source: Ambulatory Visit | Attending: Family Medicine | Admitting: Family Medicine

## 2017-01-03 DIAGNOSIS — Z1231 Encounter for screening mammogram for malignant neoplasm of breast: Secondary | ICD-10-CM | POA: Insufficient documentation

## 2017-01-16 ENCOUNTER — Encounter: Payer: Self-pay | Admitting: *Deleted

## 2017-01-17 ENCOUNTER — Ambulatory Visit: Payer: 59 | Admitting: Certified Registered Nurse Anesthetist

## 2017-01-17 ENCOUNTER — Encounter: Payer: Self-pay | Admitting: *Deleted

## 2017-01-17 ENCOUNTER — Ambulatory Visit
Admission: RE | Admit: 2017-01-17 | Discharge: 2017-01-17 | Disposition: A | Discharge status: Home / Self Care | Payer: 59 | Source: Ambulatory Visit | Attending: General Surgery | Admitting: General Surgery

## 2017-01-17 ENCOUNTER — Encounter
Admission: RE | Disposition: A | Discharge status: Home / Self Care | Payer: Self-pay | Source: Ambulatory Visit | Attending: General Surgery

## 2017-01-17 DIAGNOSIS — K562 Volvulus: Secondary | ICD-10-CM | POA: Insufficient documentation

## 2017-01-17 DIAGNOSIS — K644 Residual hemorrhoidal skin tags: Secondary | ICD-10-CM | POA: Insufficient documentation

## 2017-01-17 DIAGNOSIS — E669 Obesity, unspecified: Secondary | ICD-10-CM | POA: Diagnosis not present

## 2017-01-17 DIAGNOSIS — K5730 Diverticulosis of large intestine without perforation or abscess without bleeding: Secondary | ICD-10-CM | POA: Diagnosis not present

## 2017-01-17 DIAGNOSIS — E039 Hypothyroidism, unspecified: Secondary | ICD-10-CM | POA: Diagnosis not present

## 2017-01-17 DIAGNOSIS — Z6837 Body mass index (BMI) 37.0-37.9, adult: Secondary | ICD-10-CM | POA: Diagnosis not present

## 2017-01-17 DIAGNOSIS — F17200 Nicotine dependence, unspecified, uncomplicated: Secondary | ICD-10-CM | POA: Diagnosis not present

## 2017-01-17 DIAGNOSIS — Z79899 Other long term (current) drug therapy: Secondary | ICD-10-CM | POA: Insufficient documentation

## 2017-01-17 DIAGNOSIS — I10 Essential (primary) hypertension: Secondary | ICD-10-CM | POA: Insufficient documentation

## 2017-01-17 DIAGNOSIS — J449 Chronic obstructive pulmonary disease, unspecified: Secondary | ICD-10-CM | POA: Insufficient documentation

## 2017-01-17 DIAGNOSIS — Z1211 Encounter for screening for malignant neoplasm of colon: Secondary | ICD-10-CM | POA: Diagnosis not present

## 2017-01-17 DIAGNOSIS — K219 Gastro-esophageal reflux disease without esophagitis: Secondary | ICD-10-CM | POA: Insufficient documentation

## 2017-01-17 HISTORY — DX: Gastro-esophageal reflux disease without esophagitis: K21.9

## 2017-01-17 HISTORY — PX: COLONOSCOPY WITH PROPOFOL: SHX5780

## 2017-01-17 SURGERY — COLONOSCOPY WITH PROPOFOL
Anesthesia: General

## 2017-01-17 MED ORDER — PROPOFOL 10 MG/ML IV BOLUS
INTRAVENOUS | Status: AC
  Filled 2017-01-17: qty 20

## 2017-01-17 MED ORDER — PROPOFOL 500 MG/50ML IV EMUL
INTRAVENOUS | Status: AC
  Filled 2017-01-17: qty 50

## 2017-01-17 MED ORDER — PROPOFOL 10 MG/ML IV BOLUS
INTRAVENOUS | Status: DC | PRN
  Administered 2017-01-17: 40 mg via INTRAVENOUS

## 2017-01-17 MED ORDER — PROPOFOL 500 MG/50ML IV EMUL
INTRAVENOUS | Status: DC | PRN
  Administered 2017-01-17: 140 ug/kg/min via INTRAVENOUS

## 2017-01-17 MED ORDER — MIDAZOLAM HCL 2 MG/2ML IJ SOLN
INTRAMUSCULAR | Status: AC
  Filled 2017-01-17: qty 2

## 2017-01-17 MED ORDER — LIDOCAINE HCL (CARDIAC) 20 MG/ML IV SOLN
INTRAVENOUS | Status: DC | PRN
  Administered 2017-01-17: 50 mg via INTRAVENOUS

## 2017-01-17 MED ORDER — LIDOCAINE HCL (PF) 2 % IJ SOLN
INTRAMUSCULAR | Status: AC
  Filled 2017-01-17: qty 2

## 2017-01-17 MED ORDER — SODIUM CHLORIDE 0.9 % IV SOLN
INTRAVENOUS | Status: DC
  Administered 2017-01-17: 1000 mL via INTRAVENOUS

## 2017-01-17 MED ORDER — MIDAZOLAM HCL 2 MG/2ML IJ SOLN
INTRAMUSCULAR | Status: DC | PRN
  Administered 2017-01-17: 2 mg via INTRAVENOUS

## 2017-01-17 MED ORDER — PHENYLEPHRINE HCL 10 MG/ML IJ SOLN
INTRAMUSCULAR | Status: DC | PRN
  Administered 2017-01-17 (×2): 100 ug via INTRAVENOUS

## 2017-01-17 NOTE — Anesthesia Post-op Follow-up Note (Cosign Needed)
Anesthesia QCDR form completed.        

## 2017-01-17 NOTE — Op Note (Signed)
[facility] Gastroenterology Patient Name: Laura Kent Procedure Date: 01/17/2017 2:05 PM MRN: [id number] Account #: [id number] Date of Birth: Jun 28, 1951 Admit Type: Outpatient Age: 66 Room: [facility] ENDO ROOM 1 Gender: Female Note Status: Finalized Procedure:            Colonoscopy Indications:          Screening for colorectal malignant neoplasm Providers:            Robert Bellow, MD Referring MD:         Collie Siad (Referring MD) Medicines:            Monitored Anesthesia Care Complications:        No immediate complications. Procedure:            Pre-Anesthesia Assessment:                       - Prior to the procedure, a History and Physical was                        performed, and patient medications, allergies and                        sensitivities were reviewed. The patient's tolerance of                        previous anesthesia was reviewed.                       - The risks and benefits of the procedure and the                        sedation options and risks were discussed with the                        patient. All questions were answered and informed                        consent was obtained.                       After obtaining informed consent, the colonoscope was                        passed under direct vision. Throughout the procedure,                        the patient's blood pressure, pulse, and oxygen                        saturations were monitored continuously. The                        Colonoscope was introduced through the anus and                        advanced to the the cecum, identified by appendiceal                        orifice and ileocecal valve. The colonoscopy was  somewhat difficult due to significant looping and a                        tortuous colon. Successful completion of the procedure                        was aided by using manual pressure. The patient      tolerated the procedure well. The quality of the bowel                        preparation was excellent. Small external hemorrhoid on                        the left noted on perianal exam. Findings:      Many medium-mouthed diverticula were found in the sigmoid colon.      The perianal exam findings include thrombosed external hemorrhoids. Impression:           - Diverticulosis in the sigmoid colon. Recommendation:       - Repeat colonoscopy in 10 years for screening purposes. Procedure Code(s):    --- Professional ---                       [phone], Colonoscopy, flexible; diagnostic, including                        collection of specimen(s) by brushing or washing, when                        performed (separate procedure) Diagnosis Code(s):    --- Professional ---                       Z12.11, Encounter for screening for malignant neoplasm                        of colon                       K57.30, Diverticulosis of large intestine without                        perforation or abscess without bleeding CPT copyright 2016 American Medical Association. All rights reserved. The codes documented in this report are preliminary and upon coder review may  be revised to meet current compliance requirements. Robert Bellow, MD 01/17/2017 2:52:52 PM This report has been signed electronically. Number of Addenda: 0 Note Initiated On: 01/17/2017 2:05 PM Scope Withdrawal Time: 0 hours 9 minutes 17 seconds  Total Procedure Duration: 0 hours 34 minutes 1 second       [facility]

## 2017-01-17 NOTE — Anesthesia Procedure Notes (Signed)
Date/Time: 01/17/2017 2:11 PM Performed by: Johnna Acosta Pre-anesthesia Checklist: Patient identified, Emergency Drugs available, Suction available, Patient being monitored and Timeout performed Patient Re-evaluated:Patient Re-evaluated prior to inductionOxygen Delivery Method: Nasal cannula

## 2017-01-17 NOTE — H&P (Signed)
No change in clinical history or exam.  Tolerated prep well.  For screening colonoscopy. 

## 2017-01-17 NOTE — Transfer of Care (Signed)
Immediate Anesthesia Transfer of Care Note  Patient: Laura Kent  Procedure(s) Performed: Procedure(s): COLONOSCOPY WITH PROPOFOL (N/A)  Patient Location: PACU  Anesthesia Type:General  Level of Consciousness: sedated  Airway & Oxygen Therapy: Patient Spontanous Breathing and Patient connected to nasal cannula oxygen  Post-op Assessment: Report given to RN and Post -op Vital signs reviewed and stable  Post vital signs: Reviewed and stable  Last Vitals:  Vitals:   01/17/17 1315 01/17/17 1455  BP: 134/80 (!) 103/47  Pulse: (!) 104 86  Resp: 18 (!) 25  Temp: 37.2 C (!) 36 C    Last Pain:  Vitals:   01/17/17 1455  TempSrc: Tympanic         Complications: No apparent anesthesia complications

## 2017-01-17 NOTE — Anesthesia Preprocedure Evaluation (Signed)
Anesthesia Evaluation  Patient identified by MRN, date of birth, ID band Patient awake    Reviewed: Allergy & Precautions, NPO status , Patient's Chart, lab work & pertinent test results  Airway Mallampati: II       Dental  (+) Teeth Intact   Pulmonary COPD, Current Smoker,     + decreased breath sounds      Cardiovascular Exercise Tolerance: Good hypertension, Pt. on home beta blockers  Rhythm:Regular     Neuro/Psych    GI/Hepatic Neg liver ROS, GERD  Medicated,  Endo/Other  Hyperthyroidism Morbid obesity  Renal/GU      Musculoskeletal negative musculoskeletal ROS (+)   Abdominal (+) + obese,   Peds negative pediatric ROS (+)  Hematology   Anesthesia Other Findings   Reproductive/Obstetrics                             Anesthesia Physical Anesthesia Plan  ASA: III  Anesthesia Plan: General   Post-op Pain Management:    Induction: Intravenous  Airway Management Planned: Natural Airway and Nasal Cannula  Additional Equipment:   Intra-op Plan:   Post-operative Plan:   Informed Consent: I have reviewed the patients History and Physical, chart, labs and discussed the procedure including the risks, benefits and alternatives for the proposed anesthesia with the patient or authorized representative who has indicated his/her understanding and acceptance.     Plan Discussed with: CRNA and Surgeon  Anesthesia Plan Comments:         Anesthesia Quick Evaluation

## 2017-01-18 ENCOUNTER — Encounter: Payer: Self-pay | Admitting: General Surgery

## 2017-01-18 NOTE — Anesthesia Postprocedure Evaluation (Signed)
Anesthesia Post Note  Patient: Laura Kent  Procedure(s) Performed: Procedure(s) (LRB): COLONOSCOPY WITH PROPOFOL (N/A)  Patient location during evaluation: PACU Anesthesia Type: General Level of consciousness: awake Pain management: pain level controlled Vital Signs Assessment: post-procedure vital signs reviewed and stable Respiratory status: spontaneous breathing Cardiovascular status: stable Anesthetic complications: no     Last Vitals:  Vitals:   01/17/17 1515 01/17/17 1526  BP: 118/68 (!) 103/53  Pulse: 97 81  Resp: (!) 22 19  Temp:      Last Pain:  Vitals:   01/18/17 0726  TempSrc:   PainSc: 5                  VAN STAVEREN,Elhadji Pecore

## 2017-02-21 ENCOUNTER — Encounter: Payer: Self-pay | Admitting: General Surgery

## 2017-04-07 ENCOUNTER — Ambulatory Visit: Payer: 59 | Admitting: Family Medicine

## 2017-04-08 ENCOUNTER — Other Ambulatory Visit: Payer: Self-pay | Admitting: Family Medicine

## 2017-04-10 NOTE — Telephone Encounter (Signed)
Last Cr and K+ reviewed; Rx approved 

## 2017-04-21 DIAGNOSIS — M722 Plantar fascial fibromatosis: Secondary | ICD-10-CM | POA: Insufficient documentation

## 2017-04-21 HISTORY — DX: Plantar fascial fibromatosis: M72.2

## 2017-04-24 ENCOUNTER — Encounter: Payer: Self-pay | Admitting: Family Medicine

## 2017-04-24 ENCOUNTER — Ambulatory Visit (INDEPENDENT_AMBULATORY_CARE_PROVIDER_SITE_OTHER): Payer: 59 | Admitting: Family Medicine

## 2017-04-24 DIAGNOSIS — E0590 Thyrotoxicosis, unspecified without thyrotoxic crisis or storm: Secondary | ICD-10-CM | POA: Diagnosis not present

## 2017-04-24 DIAGNOSIS — Z5181 Encounter for therapeutic drug level monitoring: Secondary | ICD-10-CM

## 2017-04-24 DIAGNOSIS — R7301 Impaired fasting glucose: Secondary | ICD-10-CM

## 2017-04-24 DIAGNOSIS — Z6837 Body mass index (BMI) 37.0-37.9, adult: Secondary | ICD-10-CM | POA: Diagnosis not present

## 2017-04-24 DIAGNOSIS — E6609 Other obesity due to excess calories: Secondary | ICD-10-CM

## 2017-04-24 DIAGNOSIS — I10 Essential (primary) hypertension: Secondary | ICD-10-CM | POA: Diagnosis not present

## 2017-04-24 NOTE — Patient Instructions (Addendum)
Avoid salt substitutes Do try to become more active, increase gradually Check out the information at familydoctor.org entitled "Nutrition for Weight Loss: What You Need to Know about Fad Diets" Try to lose between 1-2 pounds per week by taking in fewer calories and burning off more calories You can succeed by limiting portions, limiting foods dense in calories and fat, becoming more active, and drinking 8 glasses of water a day (64 ounces) Don't skip meals, especially breakfast, as skipping meals may alter your metabolism Do not use over-the-counter weight loss pills or gimmicks that claim rapid weight loss A healthy BMI (or body mass index) is between 18.5 and 24.9 You can calculate your ideal BMI at the Kite website ClubMonetize.fr  Try to follow the DASH guidelines (DASH stands for Dietary Approaches to Stop Hypertension) Try to limit the sodium in your diet.  Ideally, consume less than 1.5 grams (less than 1,500mg ) per day. Do not add salt when cooking or at the table.  Check the sodium amount on labels when shopping, and choose items lower in sodium when given a choice. Avoid or limit foods that already contain a lot of sodium. Eat a diet rich in fruits and vegetables and whole grains.   DASH Eating Plan DASH stands for "Dietary Approaches to Stop Hypertension." The DASH eating plan is a healthy eating plan that has been shown to reduce high blood pressure (hypertension). It may also reduce your risk for type 2 diabetes, heart disease, and stroke. The DASH eating plan may also help with weight loss. What are tips for following this plan? General guidelines   Avoid eating more than 2,300 mg (milligrams) of salt (sodium) a day. If you have hypertension, you may need to reduce your sodium intake to 1,500 mg a day.  Limit alcohol intake to no more than 1 drink a day for nonpregnant women and 2 drinks a day for men. One drink equals 12 oz of  beer, 5 oz of wine, or 1 oz of hard liquor.  Work with your health care provider to maintain a healthy body weight or to lose weight. Ask what an ideal weight is for you.  Get at least 30 minutes of exercise that causes your heart to beat faster (aerobic exercise) most days of the week. Activities may include walking, swimming, or biking.  Work with your health care provider or diet and nutrition specialist (dietitian) to adjust your eating plan to your individual calorie needs. Reading food labels   Check food labels for the amount of sodium per serving. Choose foods with less than 5 percent of the Daily Value of sodium. Generally, foods with less than 300 mg of sodium per serving fit into this eating plan.  To find whole grains, look for the word "whole" as the first word in the ingredient list. Shopping   Buy products labeled as "low-sodium" or "no salt added."  Buy fresh foods. Avoid canned foods and premade or frozen meals. Cooking   Avoid adding salt when cooking. Use salt-free seasonings or herbs instead of table salt or sea salt. Check with your health care provider or pharmacist before using salt substitutes.  Do not fry foods. Cook foods using healthy methods such as baking, boiling, grilling, and broiling instead.  Cook with heart-healthy oils, such as olive, canola, soybean, or sunflower oil. Meal planning    Eat a balanced diet that includes:  5 or more servings of fruits and vegetables each day. At each meal, try to fill half  of your plate with fruits and vegetables.  Up to 6-8 servings of whole grains each day.  Less than 6 oz of lean meat, poultry, or fish each day. A 3-oz serving of meat is about the same size as a deck of cards. One egg equals 1 oz.  2 servings of low-fat dairy each day.  A serving of nuts, seeds, or beans 5 times each week.  Heart-healthy fats. Healthy fats called Omega-3 fatty acids are found in foods such as flaxseeds and coldwater fish,  like sardines, salmon, and mackerel.  Limit how much you eat of the following:  Canned or prepackaged foods.  Food that is high in trans fat, such as fried foods.  Food that is high in saturated fat, such as fatty meat.  Sweets, desserts, sugary drinks, and other foods with added sugar.  Full-fat dairy products.  Do not salt foods before eating.  Try to eat at least 2 vegetarian meals each week.  Eat more home-cooked food and less restaurant, buffet, and fast food.  When eating at a restaurant, ask that your food be prepared with less salt or no salt, if possible. What foods are recommended? The items listed may not be a complete list. Talk with your dietitian about what dietary choices are best for you. Grains  Whole-grain or whole-wheat bread. Whole-grain or whole-wheat pasta. Brown rice. [place] Morrow. Bulgur. Whole-grain and low-sodium cereals. Pita bread. Low-fat, low-sodium crackers. Whole-wheat flour tortillas. Vegetables  Fresh or frozen vegetables (raw, steamed, roasted, or grilled). Low-sodium or reduced-sodium tomato and vegetable juice. Low-sodium or reduced-sodium tomato sauce and tomato paste. Low-sodium or reduced-sodium canned vegetables. Fruits  All fresh, dried, or frozen fruit. Canned fruit in natural juice (without added sugar). Meat and other protein foods  Skinless chicken or [place]. Ground chicken or [place]. Pork with fat trimmed off. Fish and seafood. Egg whites. Dried beans, peas, or lentils. Unsalted nuts, nut butters, and seeds. Unsalted canned beans. Lean cuts of beef with fat trimmed off. Low-sodium, lean deli meat. Dairy  Low-fat (1%) or fat-free (skim) milk. Fat-free, low-fat, or reduced-fat cheeses. Nonfat, low-sodium ricotta or cottage cheese. Low-fat or nonfat yogurt. Low-fat, low-sodium cheese. Fats and oils  Soft margarine without trans fats. Vegetable oil. Low-fat, reduced-fat, or light mayonnaise and salad dressings (reduced-sodium). Canola,  safflower, olive, soybean, and sunflower oils. Avocado. Seasoning and other foods  Herbs. Spices. Seasoning mixes without salt. Unsalted popcorn and pretzels. Fat-free sweets. What foods are not recommended? The items listed may not be a complete list. Talk with your dietitian about what dietary choices are best for you. Grains  Baked goods made with fat, such as croissants, muffins, or some breads. Dry pasta or rice meal packs. Vegetables  Creamed or fried vegetables. Vegetables in a cheese sauce. Regular canned vegetables (not low-sodium or reduced-sodium). Regular canned tomato sauce and paste (not low-sodium or reduced-sodium). Regular tomato and vegetable juice (not low-sodium or reduced-sodium). Angie Fava. Olives. Fruits  Canned fruit in a light or heavy syrup. Fried fruit. Fruit in cream or butter sauce. Meat and other protein foods  Fatty cuts of meat. Ribs. Fried meat. Berniece Salines. Sausage. Bologna and other processed lunch meats. Salami. Fatback. Hotdogs. Bratwurst. Salted nuts and seeds. Canned beans with added salt. Canned or smoked fish. Whole eggs or egg yolks. Chicken or [place] with skin. Dairy  Whole or 2% milk, cream, and half-and-half. Whole or full-fat cream cheese. Whole-fat or sweetened yogurt. Full-fat cheese. Nondairy creamers. Whipped toppings. Processed cheese and cheese spreads. Fats  and oils  Butter. Stick margarine. Lard. Shortening. Ghee. Bacon fat. Tropical oils, such as coconut, palm kernel, or palm oil. Seasoning and other foods  Salted popcorn and pretzels. Onion salt, garlic salt, seasoned salt, table salt, and sea salt. Worcestershire sauce. Tartar sauce. Barbecue sauce. Teriyaki sauce. Soy sauce, including reduced-sodium. Steak sauce. Canned and packaged gravies. Fish sauce. Oyster sauce. Cocktail sauce. Horseradish that you find on the shelf. Ketchup. Mustard. Meat flavorings and tenderizers. Bouillon cubes. Hot sauce and Tabasco sauce. Premade or packaged marinades.  Premade or packaged taco seasonings. Relishes. Regular salad dressings. Where to find more information:  National Heart, Lung, and [place]: https://wilson-eaton.com/  American Heart Association: www.heart.org Summary  The DASH eating plan is a healthy eating plan that has been shown to reduce high blood pressure (hypertension). It may also reduce your risk for type 2 diabetes, heart disease, and stroke.  With the DASH eating plan, you should limit salt (sodium) intake to 2,300 mg a day. If you have hypertension, you may need to reduce your sodium intake to 1,500 mg a day.  When on the DASH eating plan, aim to eat more fresh fruits and vegetables, whole grains, lean proteins, low-fat dairy, and heart-healthy fats.  Work with your health care provider or diet and nutrition specialist (dietitian) to adjust your eating plan to your individual calorie needs. This information is not intended to replace advice given to you by your health care provider. Make sure you discuss any questions you have with your health care provider. Document Released: 11/10/2011 Document Revised: 11/14/2016 Document Reviewed: 11/14/2016 Elsevier Interactive Patient Education  2017 [employer].

## 2017-04-24 NOTE — Progress Notes (Signed)
BP (!) 142/84   Pulse (!) 106   Temp 98.2 F (36.8 C) (Oral)   Resp 16   Wt 226 lb (102.5 kg)   SpO2 99%   BMI 37.61 kg/m    Subjective:    Patient ID: Laura Kent, female    DOB: 1951-10-14, 66 y.o.   MRN: [id number]  HPI: Laura Kent is a 66 y.o. female  Chief Complaint  Patient presents with  . Follow-up    HPI Hypertension; high today from stress; had to bury her brother on Thursday; he had heart failure, on dialysis; ran out of medicine, spreading her medicine out last week to get her through the week; it was controlled before the stress and spreading out the medicine  Prediabetes; her brother had been a diabetic; no personal dry mouth or blurred vision; tries to watch a good diet A1c 6.2 on 11/21/16  Hyperthyroidism; her endocrinologist turned her loose; weight stable; stuck; not eating a lot  Obesity; metabolism low, not really exercising; not many empty calories  Reflux; doing well  Depression screen [facility] 2/9 04/24/2017 11/21/2016 05/25/2016 11/16/2015  Decreased Interest 0 0 0 0  Down, Depressed, Hopeless 1 0 0 0  PHQ - 2 Score 1 0 0 0    Relevant past medical, surgical, family and social history reviewed Past Medical History:  Diagnosis Date  . GERD (gastroesophageal reflux disease)   . Hypertension   . Hyperthyroidism    Treated with radioactive iodine 2014  . IFG (impaired fasting glucose)   . Menopausal syndrome   . Obesity   . Reflux    Past Surgical History:  Procedure Laterality Date  . BREAST CYST ASPIRATION Right 2011  . BUNIONECTOMY    . CHOLECYSTECTOMY  Oct 2014  . COLONOSCOPY WITH PROPOFOL N/A 01/17/2017   Procedure: COLONOSCOPY WITH PROPOFOL;  Surgeon: Robert Bellow, MD;  [facility]: [facility] ENDOSCOPY;  Service: Endoscopy;  Laterality: N/A;   Family History  Problem Relation Age of Onset  . Diabetes Mother   . Kidney disease Mother   . Heart disease Mother   . Hypertension Mother   . Diabetes Father   . Heart disease  Father   . Hypertension Father   . Cancer Brother        leukemia  . Heart disease Brother        tachycardia  . Stroke Brother        fatal  . Diabetes Sister   . Colon cancer Maternal Aunt   . Diabetes Maternal Grandmother   . Breast cancer Neg Hx    Social History   Social History  . Marital status: Married    Spouse name: N/A  . Number of children: N/A  . Years of education: N/A   Occupational History  . Not on file.   Social History Main Topics  . Smoking status: Current Every Day Smoker    Packs/day: 0.50    Years: 34.00    Types: Cigarettes    Last attempt to quit: 05/18/2012  . Smokeless tobacco: Never Used  . Alcohol use No  . Drug use: No  . Sexual activity: Not on file   Other Topics Concern  . Not on file   Social History Narrative  . No narrative on file    Interim medical history since last visit reviewed. Allergies and medications reviewed  Review of Systems Per HPI unless specifically indicated above     Objective:    BP (!) 142/84  Pulse (!) 106   Temp 98.2 F (36.8 C) (Oral)   Resp 16   Wt 226 lb (102.5 kg)   SpO2 99%   BMI 37.61 kg/m   Wt Readings from Last 3 Encounters:  04/24/17 226 lb (102.5 kg)  01/17/17 225 lb (102.1 kg)  12/26/16 229 lb (103.9 kg)    Physical Exam  Constitutional: She appears well-developed and well-nourished. No distress.  Obese, weight up one pound over last 3+ months  HENT:  Head: Normocephalic and atraumatic.  Eyes: EOM are normal. No scleral icterus.  Neck: No JVD present. No thyromegaly present.  Cardiovascular: Normal rate, regular rhythm and normal heart sounds.   No murmur heard. Pulmonary/Chest: Effort normal and breath sounds normal. No respiratory distress. She has no wheezes.  Abdominal: Soft. She exhibits no distension.  Musculoskeletal: Normal range of motion. She exhibits no edema.  Neurological: She is alert. She exhibits normal muscle tone.  Skin: Skin is warm and dry. She is not  diaphoretic. No pallor.  Psychiatric: She has a normal mood and affect. Her behavior is normal. Judgment and thought content normal.  Vitals reviewed.      Assessment & Plan:   Problem List Items Addressed This Visit      Cardiovascular and Mediastinum   Benign hypertension    Patient wishes to try DASH guidelines and weight loss instead of adding more medicine; see AVS; encouragement given      Relevant Orders   Lipid panel     Endocrine   Impaired fasting glucose    Work on healthy eating and weight loss; due for A1c in June      Relevant Orders   Lipid panel   Hemoglobin A1c   Hyperthyroidism    Patient reports being turned loose by endo; will check TSH with other labs in June      Relevant Orders   TSH     Other   Obesity    Encouragement given to work on weihght loss; see AVS; this will be the single most important thing she does to prevent progression to frank diabetes      Medication monitoring encounter    Check Cr and K+ on the ARB      Relevant Orders   COMPLETE METABOLIC PANEL WITH GFR       Follow up plan: Return in about 4 weeks (around 05/23/2017) for fasting labs only; return for visit with Dr. Sanda Klein after Christmas.  An after-visit summary was printed and given to the patient at [place].  Please see the patient instructions which may contain other information and recommendations beyond what is mentioned above in the assessment and plan.  No orders of the defined types were placed in this encounter.   Orders Placed This Encounter  Procedures  . TSH  . Lipid panel  . COMPLETE METABOLIC PANEL WITH GFR  . Hemoglobin A1c

## 2017-04-30 DIAGNOSIS — E0590 Thyrotoxicosis, unspecified without thyrotoxic crisis or storm: Secondary | ICD-10-CM | POA: Insufficient documentation

## 2017-04-30 NOTE — Assessment & Plan Note (Signed)
Work on healthy eating and weight loss; due for A1c in June

## 2017-04-30 NOTE — Assessment & Plan Note (Signed)
Encouragement given to work on weihght loss; see AVS; this will be the single most important thing she does to prevent progression to frank diabetes

## 2017-04-30 NOTE — Assessment & Plan Note (Signed)
Patient wishes to try DASH guidelines and weight loss instead of adding more medicine; see AVS; encouragement given

## 2017-04-30 NOTE — Assessment & Plan Note (Signed)
Check Cr and K+ on the ARB

## 2017-04-30 NOTE — Assessment & Plan Note (Signed)
Patient reports being turned loose by endo; will check TSH with other labs in June

## 2017-05-22 ENCOUNTER — Ambulatory Visit (INDEPENDENT_AMBULATORY_CARE_PROVIDER_SITE_OTHER): Payer: 59

## 2017-05-22 ENCOUNTER — Encounter: Payer: Self-pay | Admitting: Podiatry

## 2017-05-22 ENCOUNTER — Ambulatory Visit (INDEPENDENT_AMBULATORY_CARE_PROVIDER_SITE_OTHER): Payer: 59 | Admitting: Podiatry

## 2017-05-22 DIAGNOSIS — M722 Plantar fascial fibromatosis: Secondary | ICD-10-CM

## 2017-05-22 MED ORDER — MELOXICAM 15 MG PO TABS: 15.0000 mg | ORAL_TABLET | Freq: Every day | ORAL | 3 refills | Status: DC

## 2017-05-22 MED ORDER — METHYLPREDNISOLONE 4 MG PO TBPK: ORAL_TABLET | ORAL | 0 refills | Status: DC

## 2017-05-22 NOTE — Patient Instructions (Signed)

## 2017-05-22 NOTE — Progress Notes (Signed)
   Subjective:    Patient ID: Laura Kent, female    DOB: [birth year], 66 y.o.   MRN: [id number]  HPI: She presents today with a chief complaint of plantar heel pain left. She states this than aching for about a month morning pain she's tried ibuprofen and an Ace wrap nothing really seems to help.  Review of Systems  All other systems reviewed and are negative.      Objective:   Physical Exam: Vital signs are stable she's alert and 3 pulses are palpable. Neurologic sensorium is equal bilateral. Deep tendon reflexes are brisk and equal bilateral. Muscle strength was 5 over 5 dorsiflexion plantar flexors and inverters everters all intrinsic musculature is intact equal bilateral. Orthopedic evaluation demonstrates pain on palpation may continue tubercle of the left heel no pain on the right heel. No pain on lateral and medial compression of the calcaneus. Radiographs taken today do demonstrate soft tissue increase in density of the plantar fascia insertion site of the left heel with mild plantar fasciitis and mild pes planus. No open lesions or wounds are visible on physical exam.        Assessment & Plan:  Assessment: Plantar fasciitis left.  Plan: Start her on a Medrol Dosepak to be followed by meloxicam. Injected her left heel today plaster plantar fascia brace and a night splint. Discussed appropriate shoe gear stretching exercises ice therapy and shoe modifications. Follow-up with her in 1 month

## 2017-06-19 ENCOUNTER — Ambulatory Visit (INDEPENDENT_AMBULATORY_CARE_PROVIDER_SITE_OTHER): Payer: 59 | Admitting: Podiatry

## 2017-06-19 ENCOUNTER — Encounter: Payer: Self-pay | Admitting: Podiatry

## 2017-06-19 DIAGNOSIS — M722 Plantar fascial fibromatosis: Secondary | ICD-10-CM | POA: Diagnosis not present

## 2017-06-19 NOTE — Progress Notes (Signed)
She resisted A for follow-up of her plantar fasciitis to her left heel. She states that is doing approximately 90% better. She states that her really can't tell if is 100% because of still wearing the splint and one night boot.  Objective: Vital signs are stable she is alert and oriented 3. Pulses are palpable. Neurologic sensorium is intact. She has some tenderness on deep palpation medial calcaneal tubercle of the left heel. This area is much more supple than previously noted.  Assessment: Resolving plantar fasciitis 90%.  Plan: Did not except a injection today would like to consider further conservative treatment. She will continue plantar fascia brace night splint appropriate shoe gear and oral anti-inflammatories. Follow up with her in 1 month.

## 2017-07-24 ENCOUNTER — Ambulatory Visit: Payer: 59 | Admitting: Podiatry

## 2017-09-22 ENCOUNTER — Other Ambulatory Visit: Payer: Self-pay | Admitting: Family Medicine

## 2017-09-25 MED ORDER — POTASSIUM CHLORIDE CRYS ER 20 MEQ PO TBCR: 20.0000 meq | EXTENDED_RELEASE_TABLET | Freq: Every day | ORAL | 0 refills | Status: DC

## 2017-09-25 NOTE — Telephone Encounter (Signed)
Left detailed voicemial 

## 2017-09-25 NOTE — Telephone Encounter (Signed)
Please remind patient that she was due for labs in June; I'll be glad to send the prescription to mail order as soon as I see her labs, but I'll just send a limited supply locally to give her a week to get these labs done; thank you

## 2017-11-24 ENCOUNTER — Ambulatory Visit: Payer: 59 | Admitting: Family Medicine

## 2017-11-24 ENCOUNTER — Encounter: Payer: Self-pay | Admitting: Family Medicine

## 2017-11-24 VITALS — BP 136/88 | HR 100 | Temp 98.1°F | Resp 16 | Wt 234.4 lb

## 2017-11-24 DIAGNOSIS — Z5181 Encounter for therapeutic drug level monitoring: Secondary | ICD-10-CM

## 2017-11-24 DIAGNOSIS — Z1231 Encounter for screening mammogram for malignant neoplasm of breast: Secondary | ICD-10-CM | POA: Diagnosis not present

## 2017-11-24 DIAGNOSIS — E0590 Thyrotoxicosis, unspecified without thyrotoxic crisis or storm: Secondary | ICD-10-CM

## 2017-11-24 DIAGNOSIS — R7301 Impaired fasting glucose: Secondary | ICD-10-CM | POA: Diagnosis not present

## 2017-11-24 DIAGNOSIS — E6609 Other obesity due to excess calories: Secondary | ICD-10-CM | POA: Diagnosis not present

## 2017-11-24 DIAGNOSIS — Z6839 Body mass index (BMI) 39.0-39.9, adult: Secondary | ICD-10-CM | POA: Diagnosis not present

## 2017-11-24 DIAGNOSIS — Z1239 Encounter for other screening for malignant neoplasm of breast: Secondary | ICD-10-CM

## 2017-11-24 DIAGNOSIS — I10 Essential (primary) hypertension: Secondary | ICD-10-CM

## 2017-11-24 DIAGNOSIS — Z13820 Encounter for screening for osteoporosis: Secondary | ICD-10-CM

## 2017-11-24 DIAGNOSIS — Z720 Tobacco use: Secondary | ICD-10-CM

## 2017-11-24 MED ORDER — VALSARTAN-HYDROCHLOROTHIAZIDE 160-25 MG PO TABS: 1.0000 | ORAL_TABLET | Freq: Every day | ORAL | 3 refills | Status: DC

## 2017-11-24 MED ORDER — AMLODIPINE BESYLATE 5 MG PO TABS: 5.0000 mg | ORAL_TABLET | Freq: Every day | ORAL | 3 refills | Status: DC

## 2017-11-24 NOTE — Assessment & Plan Note (Signed)
Check thyroid today

## 2017-11-24 NOTE — Assessment & Plan Note (Signed)
Check glucose and A1c today; limit sugary drinks

## 2017-11-24 NOTE — Progress Notes (Signed)
BP 136/88   Pulse 100   Temp 98.1 F (36.7 C) (Oral)   Resp 16   Wt 234 lb 6.4 oz (106.3 kg)   SpO2 98%   BMI 39.01 kg/m    Subjective:    Patient ID: Laura Kent, female    DOB: Apr 15, 1951, 66 y.o.   MRN: [id number]  HPI: Laura Kent is a 66 y.o. female  Chief Complaint  Patient presents with  . Follow-up    HPI Patient is here for f/u Wearing brace on the left ankle; diagnosed with plantar fasciitis on the left; podiatrist did an injection but no help Still on meloxicam for that  High blood pressure; has not checked BP at home; controlled here today Avoid extra salt; avoiding decongestants  Prediabetes; no dry mouth; no blurred vision; lots of diabetes in the family  Obesity; activity really limited, even just climbing steps; gained weight  She has been taking potassium 20 mEq daily, from old refill from before  She is still smoking; not ready to quit; does not really want to talk about it; says she'll get it together eventually  Depression screen [facility] 2/9 11/24/2017 04/24/2017 11/21/2016 05/25/2016 11/16/2015  Decreased Interest 0 0 0 0 0  Down, Depressed, Hopeless 0 1 0 0 0  PHQ - 2 Score 0 1 0 0 0    Relevant past medical, surgical, family and social history reviewed Past Medical History:  Diagnosis Date  . GERD (gastroesophageal reflux disease)   . Hypertension   . Hyperthyroidism    Treated with radioactive iodine 2014  . IFG (impaired fasting glucose)   . Menopausal syndrome   . Obesity   . Plantar fascial fibromatosis of left foot   . Reflux    Past Surgical History:  Procedure Laterality Date  . BREAST CYST ASPIRATION Right 2011  . BUNIONECTOMY    . CHOLECYSTECTOMY  Oct 2014  . COLONOSCOPY WITH PROPOFOL N/A 01/17/2017   Procedure: COLONOSCOPY WITH PROPOFOL;  Surgeon: Robert Bellow, MD;  Location: [facility] ENDOSCOPY;  Service: Endoscopy;  Laterality: N/A;   Family History  Problem Relation Age of Onset  . Diabetes Mother   . Kidney  disease Mother   . Heart disease Mother   . Hypertension Mother   . Diabetes Father   . Heart disease Father   . Hypertension Father   . Cancer Brother        leukemia  . Heart disease Brother        tachycardia  . Stroke Brother        fatal  . Diabetes Sister   . Colon cancer Maternal Aunt   . Diabetes Maternal Grandmother   . Breast cancer Neg Hx    Social History   Tobacco Use  . Smoking status: Current Every Day Smoker    Packs/day: 0.50    Years: 34.00    Pack years: 17.00    Types: Cigarettes    Last attempt to quit: 05/18/2012    Years since quitting: 5.5  . Smokeless tobacco: Never Used  Substance Use Topics  . Alcohol use: No  . Drug use: No    Interim medical history since last visit reviewed. Allergies and medications reviewed  Review of Systems Per HPI unless specifically indicated above     Objective:    BP 136/88   Pulse 100   Temp 98.1 F (36.7 C) (Oral)   Resp 16   Wt 234 lb 6.4 oz (106.3 kg)  SpO2 98%   BMI 39.01 kg/m   Wt Readings from Last 3 Encounters:  11/24/17 234 lb 6.4 oz (106.3 kg)  04/24/17 226 lb (102.5 kg)  01/17/17 225 lb (102.1 kg)    Physical Exam  Constitutional: She appears well-developed and well-nourished. No distress.  Obese, weight gain noted  HENT:  Head: Normocephalic and atraumatic.  Eyes: EOM are normal. No scleral icterus.  Neck: No JVD present. No thyromegaly present.  Cardiovascular: Normal rate, regular rhythm and normal heart sounds.  No murmur heard. Pulmonary/Chest: Effort normal and breath sounds normal. No respiratory distress. She has no wheezes.  Abdominal: Soft. She exhibits no distension.  Musculoskeletal: She exhibits no edema.  Wearing brace on left ankle  Neurological: She is alert. She exhibits normal muscle tone.  Skin: Skin is warm and dry. She is not diaphoretic. No pallor.  Psychiatric: She has a normal mood and affect. Cognition and memory are not impaired.  Vitals  reviewed.   Results for orders placed or performed in visit on 11/21/16  CBC with Differential/Platelet  Result Value Ref Range   WBC 5.5 3.4 - 10.8 x10E3/uL   RBC 5.11 3.77 - 5.28 x10E6/uL   Hemoglobin 14.5 11.1 - 15.9 g/dL   Hematocrit 43.9 34.0 - 46.6 %   MCV 86 79 - 97 fL   MCH 28.4 26.6 - 33.0 pg   MCHC 33.0 31.5 - 35.7 g/dL   RDW 14.2 12.3 - 15.4 %   Platelets 352 150 - 379 x10E3/uL   Neutrophils 49 Not Estab. %   Lymphs 40 Not Estab. %   Monocytes 9 Not Estab. %   Eos 1 Not Estab. %   Basos 1 Not Estab. %   Neutrophils Absolute 2.7 1.4 - 7.0 x10E3/uL   Lymphocytes Absolute 2.2 0.7 - 3.1 x10E3/uL   Monocytes Absolute 0.5 0.1 - 0.9 x10E3/uL   EOS (ABSOLUTE) 0.0 0.0 - 0.4 x10E3/uL   Basophils Absolute 0.0 0.0 - 0.2 x10E3/uL   Immature Granulocytes 0 Not Estab. %   Immature Grans (Abs) 0.0 0.0 - 0.1 x10E3/uL  Lipid panel  Result Value Ref Range   Cholesterol, Total 122 100 - 199 mg/dL   Triglycerides 79 0 - 149 mg/dL   HDL 50 >39 mg/dL   VLDL Cholesterol Cal 16 5 - 40 mg/dL   LDL Calculated 56 0 - 99 mg/dL   Chol/HDL Ratio 2.4 0.0 - 4.4 ratio units  Comprehensive metabolic panel  Result Value Ref Range   Glucose 104 (H) 65 - 99 mg/dL   BUN 12 8 - 27 mg/dL   Creatinine, Ser 0.78 0.57 - 1.00 mg/dL   GFR calc non Af Amer 81 >59 mL/min/1.73   GFR calc Af Amer 93 >59 mL/min/1.73   BUN/Creatinine Ratio 15 12 - 28   Sodium 142 134 - 144 mmol/L   Potassium 4.0 3.5 - 5.2 mmol/L   Chloride 99 96 - 106 mmol/L   CO2 26 18 - 29 mmol/L   Calcium 9.5 8.7 - 10.3 mg/dL   Total Protein 6.8 6.0 - 8.5 g/dL   Albumin 4.4 3.6 - 4.8 g/dL   Globulin, Total 2.4 1.5 - 4.5 g/dL   Albumin/Globulin Ratio 1.8 1.2 - 2.2   Bilirubin Total 0.5 0.0 - 1.2 mg/dL   Alkaline Phosphatase 86 39 - 117 IU/L   AST 17 0 - 40 IU/L   ALT 15 0 - 32 IU/L  Hemoglobin A1c  Result Value Ref Range   Hgb A1c MFr  Bld 6.2 (H) 4.8 - 5.6 %   Est. average glucose Bld gHb Est-mCnc 131 mg/dL  TSH  Result Value  Ref Range   TSH 2.300 0.450 - 4.500 uIU/mL      Assessment & Plan:   Problem List Items Addressed This Visit      Cardiovascular and Mediastinum   Benign hypertension - Primary (Chronic)    Continue ARB-thiazide; increase the CCB; try the DASH guidelines      Relevant Medications   amLODipine (NORVASC) 5 MG tablet   valsartan-hydrochlorothiazide (DIOVAN-HCT) 160-25 MG tablet     Endocrine   Impaired fasting glucose (Chronic)    Check glucose and A1c today; limit sugary drinks      Hyperthyroidism    Check thyroid today        Other   Tobacco abuse    Explained smoking is bad for health and wallet; I am here to help, nonjudgmentally; she is not ready to quit      Obesity    Weight gain with recent inactivity; check TSH as well to see if playing a part      Medication monitoring encounter    Check liver and kidneys      Relevant Orders   Comprehensive metabolic panel    Other Visit Diagnoses    Screening for breast cancer       mammogram ordered   Relevant Orders   MM Digital Screening   Screening for osteoporosis       DEXA scan ordered   Relevant Orders   DG Bone Density      Orders for TSH, A1c, and lipids used from earlier; these are being drawn with CMP  Follow up plan: Return in about 2 months (around 01/25/2018) for Medicare Wellness check.  An after-visit summary was printed and given to the patient at [place].  Please see the patient instructions which may contain other information and recommendations beyond what is mentioned above in the assessment and plan.  Meds ordered this encounter  Medications  . amLODipine (NORVASC) 5 MG tablet    Sig: Take 1 tablet (5 mg total) by mouth daily.    Dispense:  90 tablet    Refill:  3    Increasing dose  . valsartan-hydrochlorothiazide (DIOVAN-HCT) 160-25 MG tablet    Sig: Take 1 tablet by mouth daily.    Dispense:  90 tablet    Refill:  3    Orders Placed This Encounter  Procedures  . MM  Digital Screening  . DG Bone Density  . Comprehensive metabolic panel

## 2017-11-24 NOTE — Addendum Note (Signed)
Addended by: Docia Furl on: 11/24/2017 09:39 AM   Modules accepted: Orders

## 2017-11-24 NOTE — Patient Instructions (Addendum)
Try to follow the DASH guidelines (DASH stands for Dietary Approaches to Stop Hypertension). Try to limit the sodium in your diet to no more than 1,500mg  of sodium per day. Certainly try to not exceed 2,000 mg per day at the very most. Do not add salt when cooking or at the table.  Check the sodium amount on labels when shopping, and choose items lower in sodium when given a choice. Avoid or limit foods that already contain a lot of sodium. Eat a diet rich in fruits and vegetables and whole grains, and try to lose weight if overweight or obese Try to use PLAIN allergy medicine without the decongestant Avoid: phenylephrine, phenylpropanolamine, and pseudoephredine Consider the new shingles vaccine called Shingrix, which you can get at your local pharmacy Avoid all black licorice and black jelly beans Avoid all salt substitutes

## 2017-11-24 NOTE — Addendum Note (Signed)
Addended by: Docia Furl on: 11/24/2017 09:38 AM   Modules accepted: Orders

## 2017-11-24 NOTE — Assessment & Plan Note (Signed)
Continue ARB-thiazide; increase the CCB; try the DASH guidelines

## 2017-11-24 NOTE — Assessment & Plan Note (Signed)
Weight gain with recent inactivity; check TSH as well to see if playing a part

## 2017-11-24 NOTE — Assessment & Plan Note (Signed)
Explained smoking is bad for health and wallet; I am here to help, nonjudgmentally; she is not ready to quit

## 2017-11-24 NOTE — Assessment & Plan Note (Signed)
Check liver and kidneys 

## 2017-11-25 LAB — COMPREHENSIVE METABOLIC PANEL
ALK PHOS: 78 IU/L (ref 39–117)
ALT: 13 IU/L (ref 0–32)
AST: 17 IU/L (ref 0–40)
Albumin/Globulin Ratio: 1.8 (ref 1.2–2.2)
Albumin: 4.2 g/dL (ref 3.6–4.8)
BUN/Creatinine Ratio: 21 (ref 12–28)
BUN: 16 mg/dL (ref 8–27)
Bilirubin Total: 0.3 mg/dL (ref 0.0–1.2)
CO2: 27 mmol/L (ref 20–29)
CREATININE: 0.76 mg/dL (ref 0.57–1.00)
Calcium: 9.6 mg/dL (ref 8.7–10.3)
Chloride: 103 mmol/L (ref 96–106)
GFR calc Af Amer: 95 mL/min/{1.73_m2} (ref >59)
GFR calc non Af Amer: 83 mL/min/{1.73_m2} (ref >59)
GLOBULIN, TOTAL: 2.3 g/dL (ref 1.5–4.5)
GLUCOSE: 90 mg/dL (ref 65–99)
Potassium: 4.3 mmol/L (ref 3.5–5.2)
SODIUM: 141 mmol/L (ref 134–144)
Total Protein: 6.5 g/dL (ref 6.0–8.5)

## 2017-11-25 LAB — LIPID PANEL
CHOLESTEROL TOTAL: 115 mg/dL (ref 100–199)
Chol/HDL Ratio: 2.2 ratio (ref 0.0–4.4)
HDL: 52 mg/dL (ref >39)
LDL Calculated: 53 mg/dL (ref 0–99)
Triglycerides: 51 mg/dL (ref 0–149)
VLDL CHOLESTEROL CAL: 10 mg/dL (ref 5–40)

## 2017-11-25 LAB — TSH: TSH: 2.54 u[IU]/mL (ref 0.450–4.500)

## 2017-11-25 LAB — HEMOGLOBIN A1C
ESTIMATED AVERAGE GLUCOSE: 137 mg/dL
Hgb A1c MFr Bld: 6.4 % — ABNORMAL HIGH (ref 4.8–5.6)

## 2017-11-27 ENCOUNTER — Other Ambulatory Visit: Payer: Self-pay | Admitting: Family Medicine

## 2017-11-27 MED ORDER — METFORMIN HCL ER 500 MG PO TB24: 500.0000 mg | ORAL_TABLET | Freq: Every day | ORAL | 0 refills | Status: DC

## 2017-11-27 NOTE — Progress Notes (Signed)
Add metformin for A1c of 6.4

## 2017-11-30 ENCOUNTER — Ambulatory Visit: Payer: 59 | Admitting: Family Medicine

## 2017-12-11 ENCOUNTER — Encounter: Payer: Self-pay | Admitting: Podiatry

## 2017-12-11 ENCOUNTER — Ambulatory Visit: Payer: 59 | Admitting: Podiatry

## 2017-12-11 DIAGNOSIS — M722 Plantar fascial fibromatosis: Secondary | ICD-10-CM | POA: Diagnosis not present

## 2017-12-11 DIAGNOSIS — M76822 Posterior tibial tendinitis, left leg: Secondary | ICD-10-CM

## 2017-12-11 MED ORDER — METHYLPREDNISOLONE 4 MG PO TBPK: ORAL_TABLET | ORAL | 0 refills | Status: DC

## 2017-12-11 NOTE — Progress Notes (Signed)
She presents today for a chief complaint of pain to the medial ankle left foot.  She states that the plantar fasciitis is resolved the left ankle is still hurting.  Denies any trauma.  Objective: Vital signs are stable alert and oriented x3.  Pulses are palpable.  She is pain on inversion against resistance and pain on palpation of the inferior distalmost aspect of the posterior tibial tendon as it courses beneath the medial malleolus.  There is overlying edema and postinflammatory hyperpigmentation in this area.  Assessment: Posterior tibial tendinitis left foot.  Plan: Injected the area today started her on a Medrol Dosepak and placed her in a short Cam walker.  She is not improved next visit an MRI will be necessary to evaluate for surgical consideration.

## 2018-01-08 ENCOUNTER — Encounter: Payer: Self-pay | Admitting: Podiatry

## 2018-01-08 ENCOUNTER — Ambulatory Visit: Payer: 59 | Admitting: Podiatry

## 2018-01-08 DIAGNOSIS — M722 Plantar fascial fibromatosis: Secondary | ICD-10-CM | POA: Diagnosis not present

## 2018-01-08 NOTE — Progress Notes (Signed)
She presents today for follow-up of her Achilles tendinitis left.  She states that it is doing a whole lot better she states I can say is 100% because I have not been wearing regular shoes.  Objective: Vital signs are stable she is alert and oriented x3.  Her Achilles tendinitis seems to be doing much better though she does have severe pain on palpation of the medial calcaneal tubercle of the left heel.  Assessment: Resolving posterior tibial tendinitis lateral compensatory syndrome Achilles tendinitis but still has symptomatic plantar fasciitis.  Plan: Injected the plantar fascia today with 20 mg of Kenalog 5 mg Marcaine after verbal consent.  She tolerated procedure well without complications I will follow-up with her in 6 weeks or sooner if needed.  She should continue her anti-inflammatories.

## 2018-01-10 ENCOUNTER — Ambulatory Visit
Admission: RE | Admit: 2018-01-10 | Discharge: 2018-01-10 | Disposition: A | Discharge status: Home / Self Care | Payer: 59 | Source: Ambulatory Visit | Attending: Family Medicine | Admitting: Family Medicine

## 2018-01-10 DIAGNOSIS — Z1231 Encounter for screening mammogram for malignant neoplasm of breast: Secondary | ICD-10-CM | POA: Insufficient documentation

## 2018-01-10 DIAGNOSIS — Z13820 Encounter for screening for osteoporosis: Secondary | ICD-10-CM

## 2018-01-10 DIAGNOSIS — Z1239 Encounter for other screening for malignant neoplasm of breast: Secondary | ICD-10-CM

## 2018-01-25 ENCOUNTER — Encounter: Payer: Self-pay | Admitting: Family Medicine

## 2018-01-25 ENCOUNTER — Ambulatory Visit: Payer: 59 | Admitting: Family Medicine

## 2018-01-25 VITALS — BP 130/78 | HR 95 | Temp 99.7°F | Ht 65.25 in | Wt 229.0 lb

## 2018-01-25 DIAGNOSIS — R05 Cough: Secondary | ICD-10-CM | POA: Diagnosis not present

## 2018-01-25 DIAGNOSIS — J069 Acute upper respiratory infection, unspecified: Secondary | ICD-10-CM | POA: Diagnosis not present

## 2018-01-25 DIAGNOSIS — R059 Cough, unspecified: Secondary | ICD-10-CM

## 2018-01-25 MED ORDER — BENZONATATE 100 MG PO CAPS: 100.0000 mg | ORAL_CAPSULE | Freq: Three times a day (TID) | ORAL | 0 refills | Status: DC | PRN

## 2018-01-25 NOTE — Progress Notes (Signed)
BP 130/78   Pulse 95   Temp 99.7 F (37.6 C) (Oral)   Ht 5' 5.25" (1.657 m)   Wt 229 lb (103.9 kg)   SpO2 99%   BMI 37.82 kg/m    Subjective:    Patient ID: Laura Kent, female    DOB: 06-26-1951, 67 y.o.   MRN: [id number]  HPI: Laura Kent is a 67 y.o. female  Chief Complaint  Patient presents with  . Cough    Since monday, stomach muscles sore from coughing     HPI Patient is here for an acute visit She started to get sick on: Monday Early symptoms included: Monday with sore throat, then went to cough, now in the chest Throat was just a tad irrited Other symptoms: nose running too, clear; cough is nonproductve;  Pertinent negatives: fever, skin rash; no sinus pressure or pain, just tingling; no wheezes; does not use inhlaer when sick; hearing, ear drainage, swollen lymph nodes; no body aches Further details: no travel Remedies tried: coricidin HBP Sick contacts: co-worker Cough keeps her up first part of night    Depression screen [facility] 2/9 01/25/2018 11/24/2017 04/24/2017 11/21/2016 05/25/2016  Decreased Interest 0 0 0 0 0  Down, Depressed, Hopeless 0 0 1 0 0  PHQ - 2 Score 0 0 1 0 0    Relevant past medical, surgical, family and social history reviewed Past Medical History:  Diagnosis Date  . GERD (gastroesophageal reflux disease)   . Hypertension   . Hyperthyroidism    Treated with radioactive iodine 2014  . IFG (impaired fasting glucose)   . Menopausal syndrome   . Obesity   . Plantar fascial fibromatosis of left foot   . Reflux    Past Surgical History:  Procedure Laterality Date  . BREAST CYST ASPIRATION Right 2011  . BUNIONECTOMY    . CHOLECYSTECTOMY  Oct 2014  . COLONOSCOPY WITH PROPOFOL N/A 01/17/2017   Procedure: COLONOSCOPY WITH PROPOFOL;  Surgeon: Robert Bellow, MD;  Location: [facility] ENDOSCOPY;  Service: Endoscopy;  Laterality: N/A;   Family History  Problem Relation Age of Onset  . Diabetes Mother   . Kidney disease Mother     . Heart disease Mother   . Hypertension Mother   . Diabetes Father   . Heart disease Father   . Hypertension Father   . Cancer Brother        leukemia  . Heart disease Brother        tachycardia  . Stroke Brother        fatal  . Diabetes Sister   . Colon cancer Maternal Aunt   . Diabetes Maternal Grandmother   . Breast cancer Neg Hx    Social History   Tobacco Use  . Smoking status: Current Every Day Smoker    Packs/day: 0.50    Years: 34.00    Pack years: 17.00    Types: Cigarettes    Last attempt to quit: 05/18/2012    Years since quitting: 5.6  . Smokeless tobacco: Never Used  Substance Use Topics  . Alcohol use: No  . Drug use: No    Interim medical history since last visit reviewed. Allergies and medications reviewed  Review of Systems Per HPI unless specifically indicated above     Objective:    BP 130/78   Pulse 95   Temp 99.7 F (37.6 C) (Oral)   Ht 5' 5.25" (1.657 m)   Wt 229 lb (103.9 kg)  SpO2 99%   BMI 37.82 kg/m   Wt Readings from Last 3 Encounters:  01/25/18 229 lb (103.9 kg)  11/24/17 234 lb 6.4 oz (106.3 kg)  04/24/17 226 lb (102.5 kg)    Physical Exam  Constitutional: She appears well-developed and well-nourished. No distress.  Weight loss of five pounds, noted and acknowledged  HENT:  Right Ear: Tympanic membrane and ear canal normal. No drainage. Tympanic membrane is not erythematous. No middle ear effusion. No decreased hearing is noted.  Left Ear: Tympanic membrane and ear canal normal. No drainage. Tympanic membrane is not erythematous.  No middle ear effusion. No decreased hearing is noted.  Nose: Rhinorrhea (clear) present. No mucosal edema.  Mouth/Throat: Oropharynx is clear and moist and mucous membranes are normal. No posterior oropharyngeal edema or posterior oropharyngeal erythema.  Eyes: EOM are normal. No scleral icterus.  Cardiovascular: Normal rate and regular rhythm.  Pulmonary/Chest: Effort normal and breath sounds  normal.  Lymphadenopathy:    She has cervical adenopathy (shoddy).       Right cervical: No posterior cervical adenopathy present.      Left cervical: No posterior cervical adenopathy present.  Skin: She is not diaphoretic. No pallor.  Psychiatric: She has a normal mood and affect. Her behavior is normal.        Assessment & Plan:   Problem List Items Addressed This Visit    None    Visit Diagnoses    Viral upper respiratory tract infection    -  Primary   explained I believe this is viral; usual discussion, ABX not indicated; tessalon perles for symptoms of cough; contagious, out of work tomorrow   Cough       likely viral URI, bronchitis; no need for antibiotics; tessalon perles for sx; no CXR indicated, but call or go to urgent care if worse       Follow up plan: Return if symptoms worsen or fail to improve.  An after-visit summary was printed and given to the patient at [place].  Please see the patient instructions which may contain other information and recommendations beyond what is mentioned above in the assessment and plan.  Meds ordered this encounter  Medications  . benzonatate (TESSALON PERLES) 100 MG capsule    Sig: Take 1 capsule (100 mg total) by mouth every 8 (eight) hours as needed for cough.    Dispense:  30 capsule    Refill:  0    No orders of the defined types were placed in this encounter.

## 2018-01-25 NOTE — Patient Instructions (Addendum)
Try vitamin C (orange juice if not diabetic or vitamin C tablets) and drink green tea to help your immune system during your illness Get plenty of rest and hydration Call or go to the urgent care if getting worse  Upper Respiratory Infection, Adult Most upper respiratory infections (URIs) are caused by a virus. A URI affects the nose, throat, and upper air passages. The most common type of URI is often called "the common cold." Follow these instructions at home:  Take medicines only as told by your doctor.  Gargle warm saltwater or take cough drops to comfort your throat as told by your doctor.  Use a warm mist humidifier or inhale steam from a shower to increase air moisture. This may make it easier to breathe.  Drink enough fluid to keep your pee (urine) clear or pale yellow.  Eat soups and other clear broths.  Have a healthy diet.  Rest as needed.  Go back to work when your fever is gone or your doctor says it is okay. ? You may need to stay home longer to avoid giving your URI to others. ? You can also wear a face mask and wash your hands often to prevent spread of the virus.  Use your inhaler more if you have asthma.  Do not use any tobacco products, including cigarettes, chewing tobacco, or electronic cigarettes. If you need help quitting, ask your doctor. Contact a doctor if:  You are getting worse, not better.  Your symptoms are not helped by medicine.  You have chills.  You are getting more short of breath.  You have brown or red mucus.  You have yellow or brown discharge from your nose.  You have pain in your face, especially when you bend forward.  You have a fever.  You have puffy (swollen) neck glands.  You have pain while swallowing.  You have white areas in the back of your throat. Get help right away if:  You have very bad or constant: ? Headache. ? Ear pain. ? Pain in your forehead, behind your eyes, and over your cheekbones (sinus  pain). ? Chest pain.  You have long-lasting (chronic) lung disease and any of the following: ? Wheezing. ? Long-lasting cough. ? Coughing up blood. ? A change in your usual mucus.  You have a stiff neck.  You have changes in your: ? Vision. ? Hearing. ? Thinking. ? Mood. This information is not intended to replace advice given to you by your health care provider. Make sure you discuss any questions you have with your health care provider. Document Released: 05/09/2008 Document Revised: 07/24/2016 Document Reviewed: 02/26/2014 Elsevier Interactive Patient Education  2018 [employer].

## 2018-02-14 ENCOUNTER — Ambulatory Visit: Payer: 59 | Admitting: Podiatry

## 2018-02-14 ENCOUNTER — Encounter: Payer: Self-pay | Admitting: Podiatry

## 2018-02-14 DIAGNOSIS — L603 Nail dystrophy: Secondary | ICD-10-CM

## 2018-02-14 DIAGNOSIS — M722 Plantar fascial fibromatosis: Secondary | ICD-10-CM

## 2018-02-14 NOTE — Progress Notes (Signed)
She presents today states that is 1 make sure that everything is okay as far as my Achilles goes.  She is also concerned about thickening of her toenails.  Objective: Vital signs are stable she is alert oriented x3 there is no erythema edema cellulitis drainage or odor.  She has no reproducible pain on palpation of the Achilles at the plantar fascia.  Her toenails are thicker and dystrophic particularly the hallux right fifth toes bilateral and the fourth digit left.  Assessment: Well-healing Achilles tendinitis plantar fasciitis.  Possible onychomycosis/nail dystrophy.  Plan: Samples of the skin and nail were taken today for pathologic evaluation we will follow-up with her in 1 month for results.

## 2018-03-01 ENCOUNTER — Telehealth: Payer: Self-pay

## 2018-03-01 NOTE — Telephone Encounter (Signed)
Left message on voice mail to call back regarding results

## 2018-03-01 NOTE — Telephone Encounter (Signed)
-----   Message from Garrel Ridgel, [place] sent at 02/28/2018  5:08 PM EDT ----- Negative fungus and does not need to follow up unless her plantar fasciitis is hurting.

## 2018-03-02 ENCOUNTER — Telehealth: Payer: Self-pay

## 2018-03-02 NOTE — Telephone Encounter (Signed)
Patient has been informed of fungal results

## 2018-03-02 NOTE — Telephone Encounter (Signed)
-----   Message from Garrel Ridgel, [place] sent at 02/28/2018  5:08 PM EDT ----- Negative fungus and does not need to follow up unless her plantar fasciitis is hurting.

## 2018-03-21 ENCOUNTER — Ambulatory Visit: Payer: 59 | Admitting: Podiatry

## 2018-06-27 ENCOUNTER — Ambulatory Visit: Payer: 59 | Admitting: Podiatry

## 2018-07-09 ENCOUNTER — Encounter: Payer: Self-pay | Admitting: Podiatry

## 2018-07-09 ENCOUNTER — Ambulatory Visit: Payer: 59 | Admitting: Podiatry

## 2018-07-09 DIAGNOSIS — M76822 Posterior tibial tendinitis, left leg: Secondary | ICD-10-CM | POA: Diagnosis not present

## 2018-07-09 DIAGNOSIS — M722 Plantar fascial fibromatosis: Secondary | ICD-10-CM

## 2018-07-09 NOTE — Progress Notes (Signed)
She presents today states following up for plantar fasciitis and her Achilles tendinitis and posterior tibial tendon area states that my my foot is just given immediately and I really cannot stand it any longer is affecting my ability to perform a daily activities I can hardly work and carry on with life.  Objective: Vital signs are stable she is alert and oriented x3 still has pain and swelling on the Achilles the plantar fascia and of the posterior tibial tendon.  Exquisite tenderness in the posterior tibial tendon with inability to maintain inversion against resistance.  Assessment: Posterior tibial tendon tear Achilles tendinitis chronic plantar fasciitis chronic  Plan: Discussed etiology pathology conservative or surgical therapies at this point am requesting an MRI due to failure of conservative treatments over the past year.

## 2018-07-13 ENCOUNTER — Telehealth: Payer: Self-pay

## 2018-07-13 NOTE — Telephone Encounter (Signed)
MRI approved from 07/12/18 to 08/26/18  Auth # [id number] Patient has been notified via voice mail and instructed to call scheduling to set up own appt

## 2018-07-27 ENCOUNTER — Ambulatory Visit
Admission: RE | Admit: 2018-07-27 | Discharge: 2018-07-27 | Disposition: A | Discharge status: Home / Self Care | Payer: 59 | Source: Ambulatory Visit | Attending: Podiatry | Admitting: Podiatry

## 2018-07-27 DIAGNOSIS — S39012A Strain of muscle, fascia and tendon of lower back, initial encounter: Secondary | ICD-10-CM | POA: Insufficient documentation

## 2018-07-27 DIAGNOSIS — X58XXXA Exposure to other specified factors, initial encounter: Secondary | ICD-10-CM | POA: Diagnosis not present

## 2018-07-27 DIAGNOSIS — M722 Plantar fascial fibromatosis: Secondary | ICD-10-CM | POA: Diagnosis present

## 2018-07-27 DIAGNOSIS — M76822 Posterior tibial tendinitis, left leg: Secondary | ICD-10-CM | POA: Diagnosis not present

## 2018-07-31 ENCOUNTER — Telehealth: Payer: Self-pay | Admitting: *Deleted

## 2018-07-31 NOTE — Telephone Encounter (Signed)
I informed pt of Dr. Stephenie Acres review of results and request to send copy of MRI to radiology specialist for more indepth information for treatment planning, there would be a 2 week delay in final results and we would call her with instructions once received. Faxed request for MRI disc to [facility].

## 2018-07-31 NOTE — Telephone Encounter (Signed)
-----   Message from Garrel Ridgel, [place] sent at 07/31/2018  7:01 AM EDT ----- Send for over read and inform patient of the delay

## 2018-08-08 NOTE — Telephone Encounter (Signed)
Mailed copy of MRI disc to [facility]. 

## 2018-08-22 NOTE — Telephone Encounter (Signed)
I informed pt I had called for the results and transferred her to the schedulers for an appt to discuss with Dr. Milinda Pointer.

## 2018-08-22 NOTE — Telephone Encounter (Signed)
[place] states the reports are ready and will fax to our office.

## 2018-08-23 ENCOUNTER — Encounter: Payer: Self-pay | Admitting: Podiatry

## 2018-08-29 ENCOUNTER — Encounter: Payer: Self-pay | Admitting: Podiatry

## 2018-08-29 ENCOUNTER — Ambulatory Visit: Payer: 59 | Admitting: Podiatry

## 2018-08-29 DIAGNOSIS — M76822 Posterior tibial tendinitis, left leg: Secondary | ICD-10-CM | POA: Diagnosis not present

## 2018-08-29 DIAGNOSIS — M722 Plantar fascial fibromatosis: Secondary | ICD-10-CM

## 2018-08-29 NOTE — Patient Instructions (Signed)
Pre-Operative Instructions  Congratulations, you have decided to take an important step towards improving your quality of life.  You can be assured that the doctors and staff at [facility] will be with you every step of the way.  Here are some important things you should know:  1. Plan to be at the surgery center/hospital at least 1 (one) hour prior to your scheduled time, unless otherwise directed by the surgical center/hospital staff.  You must have a responsible adult accompany you, remain during the surgery and drive you home.  Make sure you have directions to the surgical center/hospital to ensure you arrive on time. 2. If you are having surgery at [facility] or [facility] hospitals, you will need a copy of your medical history and physical form from your family physician within one month prior to the date of surgery. We will give you a form for your primary physician to complete.  3. We make every effort to accommodate the date you request for surgery.  However, there are times where surgery dates or times have to be moved.  We will contact you as soon as possible if a change in schedule is required.   4. No aspirin/ibuprofen for one week before surgery.  If you are on aspirin, any non-steroidal anti-inflammatory medications (Mobic, Aleve, Ibuprofen) should not be taken seven (7) days prior to your surgery.  You make take Tylenol for pain prior to surgery.  5. Medications - If you are taking daily heart and blood pressure medications, seizure, reflux, allergy, asthma, anxiety, pain or diabetes medications, make sure you notify the surgery center/hospital before the day of surgery so they can tell you which medications you should take or avoid the day of surgery. 6. No food or drink after midnight the night before surgery unless directed otherwise by surgical center/hospital staff. 7. No alcoholic beverages 24-hours prior to surgery.  No smoking 24-hours prior or 24-hours after  surgery. 8. Wear loose pants or shorts. They should be loose enough to fit over bandages, boots, and casts. 9. Don't wear slip-on shoes. Sneakers are preferred. 10. Bring your boot with you to the surgery center/hospital.  Also bring crutches or a walker if your physician has prescribed it for you.  If you do not have this equipment, it will be provided for you after surgery. 11. If you have not been contacted by the surgery center/hospital by the day before your surgery, call to confirm the date and time of your surgery. 12. Leave-time from work may vary depending on the type of surgery you have.  Appropriate arrangements should be made prior to surgery with your employer. 13. Prescriptions will be provided immediately following surgery by your doctor.  Fill these as soon as possible after surgery and take the medication as directed. Pain medications will not be refilled on weekends and must be approved by the doctor. 14. Remove nail polish on the operative foot and avoid getting pedicures prior to surgery. 15. Wash the night before surgery.  The night before surgery wash the foot and leg well with water and the antibacterial soap provided. Be sure to pay special attention to beneath the toenails and in between the toes.  Wash for at least three (3) minutes. Rinse thoroughly with water and dry well with a towel.  Perform this wash unless told not to do so by your physician.  Enclosed: 1 Ice pack (please put in freezer the night before surgery)   1 Hibiclens skin cleaner     Pre-op instructions  If you have any questions regarding the instructions, please do not hesitate to call our office.  [place]: [address], [place], [place] [address] -- [phone]  [place]: [address] [place], [place] [address] -- [phone]  [place]: [address]  [place], [place] [address] -- [phone]  [place]: [address], [address], [place], [place] [address] -- [phone]  Website:  [email or web address] 

## 2018-08-29 NOTE — Progress Notes (Signed)
She presents today for a surgical consult regarding her left foot.  She states that is so tender I can barely walk on it.  Is starting to affect my ability to perform a daily activities.  Objective: Vital signs are stable alert and oriented x3.  Pulses are palpable.  She has pain on palpation of the posterior tibial tendon along its entire course from the muscle extending to the navicular tuberosity as it courses beneath the medial malleolus.  She also has pain on palpation medial calcaneal tubercle of the left heel.  MRI demonstrates a 9.5 cm tear from the muscle belly extending to the navicular bone.  She also has chronic proximal plantar fasciitis.  Assessment plantar fasciitis posterior tibial tendon tear.  Plan: Discussed etiology pathology conservative surgical therapies at this point surgery is indicated and a consent was signed today we went over the consent today with procedures such as an endoscopic plantar fasciotomy posterior tibial tendon repair with Kidner possible flexor digitorum longus transfer.  Answered all the questions regarding these procedures best my billing layman's terms she understood it was amenable to it signed at the base of the.  She understands and is amenable to it.  We will follow-up with her in the near future for surgical intervention.  She understands she will be in a cast and nonweightbearing afterwards.  She relates no changes in her past medical history medications or allergies.

## 2018-08-31 ENCOUNTER — Telehealth: Payer: Self-pay | Admitting: *Deleted

## 2018-08-31 NOTE — Telephone Encounter (Signed)
"  I need to schedule my surgery with Dr. Milinda Pointer."  Do you have a date in mind?  "No, whatever he has available."  He can do it on November 22.  "If that's the earliest he can do it, I'll take it."  Yes, that is the earliest date.  Someone from the surgical center will call you a day or two prior to your surgical date and they will give you your arrival time.

## 2018-09-30 ENCOUNTER — Telehealth: Payer: Self-pay | Admitting: Family Medicine

## 2018-10-01 NOTE — Telephone Encounter (Signed)
Please ask pt to schedule a visit soon with me for HTN; due for labs Also, please schedule for Medicare Wellness visit

## 2018-10-02 NOTE — Telephone Encounter (Signed)
Regular office visit was scheduled with Dr Sanda Klein for 11.12.19. She did not schedule her annual wellness due to she has not signed up for medicare yet. She stated that she does not have the regular medicare yet.

## 2018-10-16 ENCOUNTER — Encounter: Payer: Self-pay | Admitting: Family Medicine

## 2018-10-16 ENCOUNTER — Ambulatory Visit: Payer: 59 | Admitting: Family Medicine

## 2018-10-16 VITALS — BP 122/80 | HR 99 | Temp 98.6°F | Ht 65.0 in | Wt 222.8 lb

## 2018-10-16 DIAGNOSIS — E0590 Thyrotoxicosis, unspecified without thyrotoxic crisis or storm: Secondary | ICD-10-CM | POA: Diagnosis not present

## 2018-10-16 DIAGNOSIS — Z6837 Body mass index (BMI) 37.0-37.9, adult: Secondary | ICD-10-CM

## 2018-10-16 DIAGNOSIS — E6609 Other obesity due to excess calories: Secondary | ICD-10-CM

## 2018-10-16 DIAGNOSIS — Z23 Encounter for immunization: Secondary | ICD-10-CM

## 2018-10-16 DIAGNOSIS — I10 Essential (primary) hypertension: Secondary | ICD-10-CM

## 2018-10-16 DIAGNOSIS — R7301 Impaired fasting glucose: Secondary | ICD-10-CM | POA: Diagnosis not present

## 2018-10-16 MED ORDER — VALSARTAN-HYDROCHLOROTHIAZIDE 160-25 MG PO TABS: 1.0000 | ORAL_TABLET | Freq: Every day | ORAL | 3 refills | Status: DC

## 2018-10-16 MED ORDER — AMLODIPINE BESYLATE 5 MG PO TABS: 5.0000 mg | ORAL_TABLET | Freq: Every day | ORAL | 3 refills | Status: DC

## 2018-10-16 NOTE — Assessment & Plan Note (Signed)
Check A1c today; ongoing weight loss most important thing to help prevent diabetes

## 2018-10-16 NOTE — Patient Instructions (Addendum)
Check out the information at familydoctor.org entitled "Nutrition for Weight Loss: What You Need to Know about Fad Diets" Try to lose between 1-2 pounds per week by taking in fewer calories and burning off more calories You can succeed by limiting portions, limiting foods dense in calories and fat, becoming more active, and drinking 8 glasses of water a day (64 ounces) Don't skip meals, especially breakfast, as skipping meals may alter your metabolism Do not use over-the-counter weight loss pills or gimmicks that claim rapid weight loss A healthy BMI (or body mass index) is between 18.5 and 24.9 You can calculate your ideal BMI at the [place] website ClubMonetize.fr  You have received the Prevnar vaccine (PCV-13) and you will not need another booster of this for the rest of your life per current ACIP guidelines  Obesity, Adult Obesity is the condition of having too much total body fat. Being overweight or obese means that your weight is greater than what is considered healthy for your body size. Obesity is determined by a measurement called BMI. BMI is an estimate of body fat and is calculated from height and weight. For adults, a BMI of 30 or higher is considered obese. Obesity can eventually lead to other health concerns and major illnesses, including:  Stroke.  Coronary artery disease (CAD).  Type 2 diabetes.  Some types of cancer, including cancers of the colon, breast, uterus, and gallbladder.  Osteoarthritis.  High blood pressure (hypertension).  High cholesterol.  Sleep apnea.  Gallbladder stones.  Infertility problems.  What are the causes? The main cause of obesity is taking in (consuming) more calories than your body uses for energy. Other factors that contribute to this condition may include:  Being born with genes that make you more likely to become obese.  Having a medical condition that causes obesity. These  conditions include: ? Hypothyroidism. ? Polycystic ovarian syndrome (PCOS). ? Binge-eating disorder. ? Cushing syndrome.  Taking certain medicines, such as steroids, antidepressants, and seizure medicines.  Not being physically active (sedentary lifestyle).  Living where there are limited places to exercise safely or buy healthy foods.  Not getting enough sleep.  What increases the risk? The following factors may increase your risk of this condition:  Having a family history of obesity.  Being a woman of African-American descent.  Being a man of Hispanic descent.  What are the signs or symptoms? Having excessive body fat is the main symptom of this condition. How is this diagnosed? This condition may be diagnosed based on:  Your symptoms.  Your medical history.  A physical exam. Your health care provider may measure: ? Your BMI. If you are an adult with a BMI between 25 and less than 30, you are considered overweight. If you are an adult with a BMI of 30 or higher, you are considered obese. ? The distances around your hips and your waist (circumferences). These may be compared to each other to help diagnose your condition. ? Your skinfold thickness. Your health care provider may gently pinch a fold of your skin and measure it.  How is this treated? Treatment for this condition often includes changing your lifestyle. Treatment may include some or all of the following:  Dietary changes. Work with your health care provider and a dietitian to set a weight-loss goal that is healthy and reasonable for you. Dietary changes may include eating: ? Smaller portions. A portion size is the amount of a particular food that is healthy for you to eat at  one time. This varies from person to person. ? Low-calorie or low-fat options. ? More whole grains, fruits, and vegetables.  Regular physical activity. This may include aerobic activity (cardio) and strength training.  Medicine to help  you lose weight. Your health care provider may prescribe medicine if you are unable to lose 1 pound a week after 6 weeks of eating more healthily and doing more physical activity.  Surgery. Surgical options may include gastric banding and gastric bypass. Surgery may be done if: ? Other treatments have not helped to improve your condition. ? You have a BMI of 40 or higher. ? You have life-threatening health problems related to obesity.  Follow these instructions at home:  Eating and drinking   Follow recommendations from your health care provider about what you eat and drink. Your health care provider may advise you to: ? Limit fast foods, sweets, and processed snack foods. ? Choose low-fat options, such as low-fat milk instead of whole milk. ? Eat 5 or more servings of fruits or vegetables every day. ? Eat at home more often. This gives you more control over what you eat. ? Choose healthy foods when you eat out. ? Learn what a healthy portion size is. ? Keep low-fat snacks on hand. ? Avoid sugary drinks, such as soda, fruit juice, iced tea sweetened with sugar, and flavored milk. ? Eat a healthy breakfast.  Drink enough water to keep your urine clear or pale yellow.  Do not go without eating for long periods of time (do not fast) or follow a fad diet. Fasting and fad diets can be unhealthy and even dangerous. Physical Activity  Exercise regularly, as told by your health care provider. Ask your health care provider what types of exercise are safe for you and how often you should exercise.  Warm up and stretch before being active.  Cool down and stretch after being active.  Rest between periods of activity. Lifestyle  Limit the time that you spend in front of your TV, computer, or video game system.  Find ways to reward yourself that do not involve food.  Limit alcohol intake to no more than 1 drink a day for nonpregnant women and 2 drinks a day for men. One drink equals 12 oz  of beer, 5 oz of wine, or 1 oz of hard liquor. General instructions  Keep a weight loss journal to keep track of the food you eat and how much you exercise you get.  Take over-the-counter and prescription medicines only as told by your health care provider.  Take vitamins and supplements only as told by your health care provider.  Consider joining a support group. Your health care provider may be able to recommend a support group.  Keep all follow-up visits as told by your health care provider. This is important. Contact a health care provider if:  You are unable to meet your weight loss goal after 6 weeks of dietary and lifestyle changes. This information is not intended to replace advice given to you by your health care provider. Make sure you discuss any questions you have with your health care provider. Document Released: 12/29/2004 Document Revised: 04/25/2016 Document Reviewed: 09/09/2015 Elsevier Interactive Patient Education  2018 [place].  Preventing Unhealthy [employer], Adult Staying at a healthy weight is important. When fat builds up in your body, you may become overweight or obese. These conditions put you at greater risk for developing certain health problems, such as heart disease, diabetes, sleeping problems,  joint problems, and some cancers. Unhealthy weight gain is often the result of making unhealthy choices in what you eat. It is also a result of not getting enough exercise. You can make changes to your lifestyle to prevent obesity and stay as healthy as possible. What nutrition changes can be made? To maintain a healthy weight and prevent obesity:  Eat only as much as your body needs. To do this: ? Pay attention to signs that you are hungry or full. Stop eating as soon as you feel full. ? If you feel hungry, try drinking water first. Drink enough water so your urine is clear or pale yellow. ? Eat smaller portions. ? Look at serving sizes on food labels. Most  foods contain more than one serving per container. ? Eat the recommended amount of calories for your gender and activity level. While most active people should eat around 2,000 calories per day, if you are trying to lose weight or are not very active, you main need to eat less calories. Talk to your health care provider or dietitian about how many calories you should eat each day.  Choose healthy foods, such as: ? Fruits and vegetables. Try to fill at least half of your plate at each meal with fruits and vegetables. ? Whole grains, such as whole wheat bread, brown rice, and quinoa. ? Lean meats, such as chicken or fish. ? Other healthy proteins, such as beans, eggs, or tofu. ? Healthy fats, such as nuts, seeds, fatty fish, and olive oil. ? Low-fat or fat-free dairy.  Check food labels and avoid food and drinks that: ? Are high in calories. ? Have added sugar. ? Are high in sodium. ? Have saturated fats or trans fats.  Limit how much you eat of the following foods: ? Prepackaged meals. ? Fast food. ? Fried foods. ? Processed meat, such as bacon, sausage, and deli meats. ? Fatty cuts of red meat and poultry with skin.  Cook foods in healthier ways, such as by baking, broiling, or grilling.  When grocery shopping, try to shop around the outside of the store. This helps you buy mostly fresh foods and avoid canned and prepackaged foods.  What lifestyle changes can be made?  Exercise at least 30 minutes 5 or more days each week. Exercising includes brisk walking, yard work, biking, running, swimming, and team sports like basketball and soccer. Ask your health care provider which exercises are safe for you.  Do not use any products that contain nicotine or tobacco, such as cigarettes and e-cigarettes. If you need help quitting, ask your health care provider.  Limit alcohol intake to no more than 1 drink a day for nonpregnant women and 2 drinks a day for men. One drink equals 12 oz of beer,  5 oz of wine, or 1 oz of hard liquor.  Try to get 7-9 hours of sleep each night. What other changes can be made?  Keep a food and activity journal to keep track of: ? What you ate and how many calories you had. Remember to count sauces, dressings, and side dishes. ? Whether you were active, and what exercises you did. ? Your calorie, weight, and activity goals.  Check your weight regularly. Track any changes. If you notice you have gained weight, make changes to your diet or activity routine.  Avoid taking weight-loss medicines or supplements. Talk to your health care provider before starting any new medicine or supplement.  Talk to your health care provider before  trying any new diet or exercise plan. Why are these changes important? Eating healthy, staying active, and having healthy habits not only help prevent obesity, they also:  Help you to manage stress and emotions.  Help you to connect with friends and family.  Improve your self-esteem.  Improve your sleep.  Prevent long-term health problems.  What can happen if changes are not made? Being obese or overweight can cause you to develop joint or bone problems, which can make it hard for you to stay active or do activities you enjoy. Being obese or overweight also puts stress on your heart and lungs and can lead to health problems like diabetes, heart disease, and some cancers. Where to find more information: Talk with your health care provider or a dietitian about healthy eating and healthy lifestyle choices. You may also find other information through these resources:  U.S. Department of Agriculture MyPlate: FormerBoss.no  American Heart Association: www.heart.org  Centers for Disease Control and Prevention: http://www.wolf.info/  Summary  Staying at a healthy weight is important. It helps prevent certain diseases and health problems, such as heart disease, diabetes, joint problems, sleep disorders, and some  cancers.  Being obese or overweight can cause you to develop joint or bone problems, which can make it hard for you to stay active or do activities you enjoy.  You can prevent unhealthy weight gain by eating a healthy diet, exercising regularly, not smoking, limiting alcohol, and getting enough sleep.  Talk with your health care provider or a dietitian for guidance about healthy eating and healthy lifestyle choices. This information is not intended to replace advice given to you by your health care provider. Make sure you discuss any questions you have with your health care provider. Document Released: 11/22/2016 Document Revised: 12/28/2016 Document Reviewed: 12/28/2016 Elsevier Interactive Patient Education  [employer].

## 2018-10-16 NOTE — Assessment & Plan Note (Signed)
Encouraged ongoing weight loss, down 12 pounds in less than a year

## 2018-10-16 NOTE — Assessment & Plan Note (Signed)
Controlled today; continue meds; refills provided

## 2018-10-16 NOTE — Assessment & Plan Note (Signed)
Check TSH today

## 2018-10-16 NOTE — Progress Notes (Signed)
BP 122/80   Pulse 99   Temp 98.6 F (37 C) (Oral)   Ht 5\' 5"  (1.651 m)   Wt 222 lb 12.8 oz (101.1 kg)   SpO2 93%   BMI 37.08 kg/m    Subjective:    Patient ID: Laura Kent, female    DOB: 09-Oct-1951, 67 y.o.   MRN: [id number]  HPI: Laura Kent is a 67 y.o. female  Chief Complaint  Patient presents with  . Follow-up  . Medication Refill    HPI Patient is here for f/u  She has hypertension; first BP was elevated, but the recheck was 122/80 Heart rate was high at first and that also came down She is taking amlodipine 5 mg and needs refills Also taking valsartan-hctz and needs refills of that too She is taking 81 mg baby aspirin daily  Prediabetes; last A1c was 6.4; metformin was sent in with that elevated reading but she is not taking it; just did not know to take it; she has MyChart but didn't check; no dry mouth; no frequent urination; father does not have DM, but almost everybody else does; not many sugary drinks; black coffee  Fantastic cholesterol; last LDL was 53 in Dec 2018; not on medicine  Going to have foot surgery on Nov 22nd, DR. Hyatt  Obesity; she has lost 7 pounds; just not eating as much  Depression screen [facility] 2/9 10/16/2018 01/25/2018 11/24/2017 04/24/2017 11/21/2016  Decreased Interest 0 0 0 0 0  Down, Depressed, Hopeless 0 0 0 1 0  PHQ - 2 Score 0 0 0 1 0  Altered sleeping 0 - - - -  Tired, decreased energy 0 - - - -  Change in appetite 0 - - - -  Feeling bad or failure about yourself  0 - - - -  Trouble concentrating 0 - - - -  Moving slowly or fidgety/restless 0 - - - -  Suicidal thoughts 0 - - - -  PHQ-9 Score 0 - - - -  Difficult doing work/chores Not difficult at all - - - -   Fall Risk  10/16/2018 01/25/2018 11/24/2017 04/24/2017 11/21/2016  Falls in the past year? 0 No No No No  Number falls in past yr: 0 - - - -    Relevant past medical, surgical, family and social history reviewed Past Medical History:  Diagnosis Date  .  GERD (gastroesophageal reflux disease)   . Hypertension   . Hyperthyroidism    Treated with radioactive iodine 2014  . IFG (impaired fasting glucose)   . Menopausal syndrome   . Obesity   . Plantar fascial fibromatosis of left foot   . Reflux    Past Surgical History:  Procedure Laterality Date  . BREAST CYST ASPIRATION Right 2011  . BUNIONECTOMY    . CHOLECYSTECTOMY  Oct 2014  . COLONOSCOPY WITH PROPOFOL N/A 01/17/2017   Procedure: COLONOSCOPY WITH PROPOFOL;  Surgeon: Robert Bellow, MD;  Location[facility] ENDOSCOPY;  Service: Endoscopy;  Laterality: N/A;   Family History  Problem Relation Age of Onset  . Diabetes Mother   . Kidney disease Mother   . Heart disease Mother   . Hypertension Mother   . Diabetes Father   . Heart disease Father   . Hypertension Father   . Cancer Brother        leukemia  . Heart disease Brother        tachycardia  . Stroke Brother  fatal  . Diabetes Sister   . Colon cancer Maternal Aunt   . Diabetes Maternal Grandmother   . Breast cancer Neg Hx    Social History   Tobacco Use  . Smoking status: Current Every Day Smoker    Packs/day: 0.50    Years: 34.00    Pack years: 17.00    Types: Cigarettes    Last attempt to quit: 05/18/2012    Years since quitting: 6.4  . Smokeless tobacco: Never Used  Substance Use Topics  . Alcohol use: No  . Drug use: No     Office Visit from 10/16/2018 in [facility]  AUDIT-C Score  0      Interim medical history since last visit reviewed. Allergies and medications reviewed  Review of Systems Per HPI unless specifically indicated above     Objective:    BP 122/80   Pulse 99   Temp 98.6 F (37 C) (Oral)   Ht 5\' 5"  (1.651 m)   Wt 222 lb 12.8 oz (101.1 kg)   SpO2 93%   BMI 37.08 kg/m   Wt Readings from Last 3 Encounters:  10/16/18 222 lb 12.8 oz (101.1 kg)  01/25/18 229 lb (103.9 kg)  11/24/17 234 lb 6.4 oz (106.3 kg)    Physical Exam  Constitutional: She  appears well-developed and well-nourished. No distress.  HENT:  Head: Normocephalic and atraumatic.  Eyes: EOM are normal. No scleral icterus.  Neck: No thyromegaly present.  Cardiovascular: Normal rate, regular rhythm and normal heart sounds.  No murmur heard. Pulmonary/Chest: Effort normal and breath sounds normal. No respiratory distress. She has no wheezes.  Abdominal: Soft. Bowel sounds are normal. She exhibits no distension.  Musculoskeletal: She exhibits no edema.  Neurological: She is alert.  Skin: Skin is warm and dry. She is not diaphoretic. No pallor.  Psychiatric: She has a normal mood and affect. Her behavior is normal. Judgment and thought content normal.    Results for orders placed or performed in visit on 11/24/17  Comprehensive metabolic panel  Result Value Ref Range   Glucose 90 65 - 99 mg/dL   BUN 16 8 - 27 mg/dL   Creatinine, Ser 0.76 0.57 - 1.00 mg/dL   GFR calc non Af Amer 83 >59 mL/min/1.73   GFR calc Af Amer 95 >59 mL/min/1.73   BUN/Creatinine Ratio 21 12 - 28   Sodium 141 134 - 144 mmol/L   Potassium 4.3 3.5 - 5.2 mmol/L   Chloride 103 96 - 106 mmol/L   CO2 27 20 - 29 mmol/L   Calcium 9.6 8.7 - 10.3 mg/dL   Total Protein 6.5 6.0 - 8.5 g/dL   Albumin 4.2 3.6 - 4.8 g/dL   Globulin, Total 2.3 1.5 - 4.5 g/dL   Albumin/Globulin Ratio 1.8 1.2 - 2.2   Bilirubin Total 0.3 0.0 - 1.2 mg/dL   Alkaline Phosphatase 78 39 - 117 IU/L   AST 17 0 - 40 IU/L   ALT 13 0 - 32 IU/L  TSH  Result Value Ref Range   TSH 2.540 0.450 - 4.500 uIU/mL  Lipid panel  Result Value Ref Range   Cholesterol, Total 115 100 - 199 mg/dL   Triglycerides 51 0 - 149 mg/dL   HDL 52 >39 mg/dL   VLDL Cholesterol Cal 10 5 - 40 mg/dL   LDL Calculated 53 0 - 99 mg/dL   Chol/HDL Ratio 2.2 0.0 - 4.4 ratio  Hemoglobin A1c  Result Value Ref Range  Hgb A1c MFr Bld 6.4 (H) 4.8 - 5.6 %   Est. average glucose Bld gHb Est-mCnc 137 mg/dL      Assessment & Plan:   Problem List Items Addressed  This Visit      Cardiovascular and Mediastinum   Benign hypertension (Chronic)    Controlled today; continue meds; refills provided      Relevant Medications   valsartan-hydrochlorothiazide (DIOVAN-HCT) 160-25 MG tablet   amLODipine (NORVASC) 5 MG tablet     Endocrine   Impaired fasting glucose - Primary (Chronic)    Check A1c today; ongoing weight loss most important thing to help prevent diabetes      Relevant Orders   Basic metabolic panel   Hemoglobin A1c   Hyperthyroidism    Check TSH today      Relevant Orders   TSH     Other   Obesity    Encouraged ongoing weight loss, down 12 pounds in less than a year       Other Visit Diagnoses    Need for vaccination with 13-polyvalent pneumococcal conjugate vaccine       Relevant Orders   Pneumococcal conjugate vaccine 13-valent IM (Completed)       Follow up plan: Return in about 6 months (around 04/16/2019) for follow-up visit with Dr. Sanda Klein (or just after).  An after-visit summary was printed and given to the patient at [facility].  Please see the patient instructions which may contain other information and recommendations beyond what is mentioned above in the assessment and plan.  Meds ordered this encounter  Medications  . valsartan-hydrochlorothiazide (DIOVAN-HCT) 160-25 MG tablet    Sig: Take 1 tablet by mouth daily.    Dispense:  90 tablet    Refill:  3  . amLODipine (NORVASC) 5 MG tablet    Sig: Take 1 tablet (5 mg total) by mouth daily.    Dispense:  90 tablet    Refill:  3    Orders Placed This Encounter  Procedures  . Pneumococcal conjugate vaccine 13-valent IM  . Basic metabolic panel  . TSH  . Hemoglobin A1c

## 2018-10-17 LAB — BASIC METABOLIC PANEL
BUN / CREAT RATIO: 21 (ref 12–28)
BUN: 16 mg/dL (ref 8–27)
CHLORIDE: 101 mmol/L (ref 96–106)
CO2: 24 mmol/L (ref 20–29)
Calcium: 10 mg/dL (ref 8.7–10.3)
Creatinine, Ser: 0.77 mg/dL (ref 0.57–1.00)
GFR calc non Af Amer: 81 mL/min/{1.73_m2} (ref >59)
GFR, EST AFRICAN AMERICAN: 94 mL/min/{1.73_m2} (ref >59)
GLUCOSE: 101 mg/dL — AB (ref 65–99)
POTASSIUM: 3.7 mmol/L (ref 3.5–5.2)
SODIUM: 141 mmol/L (ref 134–144)

## 2018-10-17 LAB — HEMOGLOBIN A1C
ESTIMATED AVERAGE GLUCOSE: 131 mg/dL
HEMOGLOBIN A1C: 6.2 % — AB (ref 4.8–5.6)

## 2018-10-17 LAB — TSH: TSH: 2.85 u[IU]/mL (ref 0.450–4.500)

## 2018-10-22 ENCOUNTER — Ambulatory Visit: Payer: 59 | Admitting: Family Medicine

## 2018-10-24 ENCOUNTER — Other Ambulatory Visit: Payer: Self-pay | Admitting: Podiatry

## 2018-10-24 MED ORDER — ONDANSETRON HCL 4 MG PO TABS: 4.0000 mg | ORAL_TABLET | Freq: Three times a day (TID) | ORAL | 0 refills | Status: DC | PRN

## 2018-10-24 MED ORDER — CEPHALEXIN 500 MG PO CAPS: 500.0000 mg | ORAL_CAPSULE | Freq: Three times a day (TID) | ORAL | 0 refills | Status: DC

## 2018-10-24 MED ORDER — OXYCODONE-ACETAMINOPHEN 10-325 MG PO TABS: 1.0000 | ORAL_TABLET | Freq: Four times a day (QID) | ORAL | 0 refills | Status: AC | PRN

## 2018-10-26 ENCOUNTER — Encounter: Payer: Self-pay | Admitting: Podiatry

## 2018-10-26 DIAGNOSIS — M722 Plantar fascial fibromatosis: Secondary | ICD-10-CM

## 2018-10-26 DIAGNOSIS — M76822 Posterior tibial tendinitis, left leg: Secondary | ICD-10-CM

## 2018-10-26 DIAGNOSIS — M2042 Other hammer toe(s) (acquired), left foot: Secondary | ICD-10-CM

## 2018-10-26 HISTORY — PX: POSTERIOR TIBIAL TENDON REPAIR: SHX6039

## 2018-10-30 ENCOUNTER — Ambulatory Visit (INDEPENDENT_AMBULATORY_CARE_PROVIDER_SITE_OTHER): Payer: 59 | Admitting: Podiatry

## 2018-10-30 ENCOUNTER — Encounter: Payer: Self-pay | Admitting: Podiatry

## 2018-10-30 DIAGNOSIS — M722 Plantar fascial fibromatosis: Secondary | ICD-10-CM

## 2018-10-30 DIAGNOSIS — Z9889 Other specified postprocedural states: Secondary | ICD-10-CM

## 2018-10-30 MED ORDER — TRAMADOL HCL 50 MG PO TABS: 50.0000 mg | ORAL_TABLET | Freq: Three times a day (TID) | ORAL | 0 refills | Status: DC | PRN

## 2018-10-31 NOTE — Progress Notes (Signed)
   Subjective:  Patient presents today status post EPF and PT tendon repair left. DOS: 10/26/18. She states she is doing well. She denies any significant pain or modifying factors. She has been using the knee scooter and is still nonweightbearing. Patient is here for further evaluation and treatment.    Past Medical History:  Diagnosis Date  . GERD (gastroesophageal reflux disease)   . Hypertension   . Hyperthyroidism    Treated with radioactive iodine 2014  . IFG (impaired fasting glucose)   . Menopausal syndrome   . Obesity   . Plantar fascial fibromatosis of left foot   . Reflux       Objective/Physical Exam Neurovascular status intact. Cast left intact.   Assessment: 1. s/p EPF and PT tendon repair left. DOS: 10/26/18   Plan of Care:  1. Patient was evaluated.  2. Prescription for Tramadol provided to patient.  3. Continue nonweightbearing with knee scooter.  4. Return to clinic in one week for cast change.    Edrick Kins, DPM [facility]  Dr. Edrick Kins, [address]                                        [place], [place] [address]                Office [phone]  Fax [phone]

## 2018-11-06 DIAGNOSIS — M79676 Pain in unspecified toe(s): Secondary | ICD-10-CM

## 2018-11-12 ENCOUNTER — Encounter: Payer: Self-pay | Admitting: Podiatry

## 2018-11-12 ENCOUNTER — Ambulatory Visit (INDEPENDENT_AMBULATORY_CARE_PROVIDER_SITE_OTHER): Payer: 59 | Admitting: Podiatry

## 2018-11-12 DIAGNOSIS — M76822 Posterior tibial tendinitis, left leg: Secondary | ICD-10-CM

## 2018-11-12 DIAGNOSIS — Z9889 Other specified postprocedural states: Secondary | ICD-10-CM

## 2018-11-12 NOTE — Progress Notes (Signed)
She presents today status post Kidner procedure with repair posterior tibial tendon as well as endoscopic plantar fasciotomy left foot.  Date of surgery 10/26/2018.  Appears to be doing well.  Denies fever chills nausea muscle aches pains calf pain back pain chest pain shortness of breath.  Objective: Presents today cast intact vital signs are stable alert and oriented x3.  Pulses are palpable.  Once the cast was removed demonstrates minimal edema no erythema cellulitis drainage or odor no active bleeding.  Staples are intact margins well coapted.  Assessment: Well-healing surgical foot.  Plan: Reapply cast and a dry sterile compressive dressing follow-up with her in 2 weeks for cast removal

## 2018-11-26 ENCOUNTER — Ambulatory Visit (INDEPENDENT_AMBULATORY_CARE_PROVIDER_SITE_OTHER): Payer: 59 | Admitting: Podiatry

## 2018-11-26 VITALS — BP 144/76 | HR 98 | Temp 97.6°F | Resp 16

## 2018-11-26 DIAGNOSIS — Z9889 Other specified postprocedural states: Secondary | ICD-10-CM | POA: Diagnosis not present

## 2018-11-26 NOTE — Progress Notes (Signed)
She presents today for her postop visit date of surgery 10/26/2018.  States that she is doing pretty well pain is 2 out of 10 every now and then she takes ibuprofen for the discomfort.  She denies fever chills nausea vomiting muscle aches pains calf pain back pain chest pain.  Objective: Presents today using her knee scooter nonweightbearing.  Cast is intact and clean on the bottom.  Vital signs are stable she is alert and oriented x3.  Pulses are palpable once the cast was removed.  Dry sterile dressing was intact once removed.  Demonstrates staples intact these were removed today margins remain well coapted.  Inversion against resistance with no pain.  Assessment: Well-healing surgical foot.  Plan: Discussed etiology pathology conservative surgical therapies at this point external compression anklet in her Cam walker.  She will remain nonweightbearing for the next 2 weeks I will allow her to start getting this wet tomorrow.  She will then progress to partial weightbearing using her scooter and crutches for the 2 weeks after that and I will follow-up with her in 1 month.  We will extend her out of work benefits for at least another 4 to 8 weeks depending on her progress.  Is a strong possibility that she may need physical therapy.

## 2018-11-30 ENCOUNTER — Encounter: Payer: Self-pay | Admitting: Podiatry

## 2018-12-12 ENCOUNTER — Encounter: Payer: 59 | Admitting: Podiatry

## 2018-12-24 ENCOUNTER — Ambulatory Visit (INDEPENDENT_AMBULATORY_CARE_PROVIDER_SITE_OTHER): Payer: Managed Care, Other (non HMO) | Admitting: Podiatry

## 2018-12-24 ENCOUNTER — Encounter: Payer: Self-pay | Admitting: Podiatry

## 2018-12-24 DIAGNOSIS — M722 Plantar fascial fibromatosis: Secondary | ICD-10-CM

## 2018-12-24 DIAGNOSIS — M76822 Posterior tibial tendinitis, left leg: Secondary | ICD-10-CM

## 2018-12-24 DIAGNOSIS — Z9889 Other specified postprocedural states: Secondary | ICD-10-CM

## 2018-12-24 NOTE — Progress Notes (Signed)
She presents today date of surgery 10/26/2018 status post EPF left with posterior tibial tendon repair and Kidner procedure with the posterior flexor digitorum transfer states that it seems to be doing better once in a while I have a pain that shoots through it.  Objective: Vital signs are stable alert oriented x3.  Pulses are palpable.  She has mild edema at the surgical site but the wound appears to be healing very nicely she has good inversion against resistance no pain to palpation.  Assessment: Well-healing EPF and posterior tibial tendon repair.  Plan: At this point I will place her in a Tri-Lock brace and allow her to get back and forth between the Tri-Lock brace and the cam walker until she can ambulate without pain.  Ultimately ambulating primarily in the Tri-Lock brace with a tennis shoe.  We will follow-up with her in 3 to 4 weeks.

## 2019-01-16 ENCOUNTER — Encounter: Payer: Self-pay | Admitting: Podiatry

## 2019-01-16 ENCOUNTER — Ambulatory Visit (INDEPENDENT_AMBULATORY_CARE_PROVIDER_SITE_OTHER): Payer: Managed Care, Other (non HMO) | Admitting: Podiatry

## 2019-01-16 DIAGNOSIS — M722 Plantar fascial fibromatosis: Secondary | ICD-10-CM

## 2019-01-16 DIAGNOSIS — Z9889 Other specified postprocedural states: Secondary | ICD-10-CM

## 2019-01-16 DIAGNOSIS — M76822 Posterior tibial tendinitis, left leg: Secondary | ICD-10-CM

## 2019-01-16 NOTE — Progress Notes (Signed)
She presents today for postop visit date of surgery 10/26/2018 status post EPF left posterior tibial tendon repair with a flexor digitorum longus transfer.  She states that her cephalic only to long seems to be weak when I twisted the other day felt like it sort of pulled a little bit.  She denies fever chills nausea vomiting muscle aches and pains other than the surgical foot.  Objective: Vital signs are stable she is alert and oriented x3 much decrease in edema no erythema cellulitis drainage odor she has good inversion against resistance with some mild tenderness but very minimal.  Assessment: Well-healing surgical foot pes planus.  Repair of the posterior tibial tendon and plantar fascia.  Plan: At this point I like her to get back into regular shoe gear with a new pair of orthotics which are you are going to construct today she was molded for orthotics today and we are also going to send her to physical therapy.  During this time I will allow her to get back to work however I need her to be able to be at physical therapy 2-3 times a week.  I will follow-up with her in 1 month and time to pick up the orthotics and to reevaluate after physical therapy.

## 2019-02-13 ENCOUNTER — Encounter: Payer: Self-pay | Admitting: Podiatry

## 2019-02-13 ENCOUNTER — Other Ambulatory Visit: Payer: Self-pay

## 2019-02-13 ENCOUNTER — Ambulatory Visit (INDEPENDENT_AMBULATORY_CARE_PROVIDER_SITE_OTHER): Payer: Managed Care, Other (non HMO) | Admitting: Podiatry

## 2019-02-13 DIAGNOSIS — M76822 Posterior tibial tendinitis, left leg: Secondary | ICD-10-CM | POA: Diagnosis not present

## 2019-02-13 DIAGNOSIS — M722 Plantar fascial fibromatosis: Secondary | ICD-10-CM | POA: Diagnosis not present

## 2019-02-13 DIAGNOSIS — Z9889 Other specified postprocedural states: Secondary | ICD-10-CM

## 2019-02-13 NOTE — Progress Notes (Signed)
She presents today date of surgery October 26, 2018 status post EPF and a posterior tibial tendon repair with Kidner.  She states that is getting a lot better but is just a slight process continues to use of physical therapy.  Objective: Vital signs are stable alert and oriented x3.  Pulses are palpable.  She has good inversion against resistance.  Assessment: She is healing very nicely.  Plan: She is picking up orthotics from [facility] today she will continue physical therapy I will follow-up with her in about a month or so.

## 2019-02-24 IMAGING — MR MR ANKLE*L* W/O CM
5 series · 40 of 40 positions shown · non-contrast
Comparison: Foot radiographs from 05/22/2017

CLINICAL DATA: Medial left ankle pain and swelling for 6 months.

EXAM:
MRI OF THE LEFT ANKLE WITHOUT CONTRAST
TECHNIQUE: Multiplanar, multisequence MR imaging of the ankle was performed. No
intravenous contrast was administered.

[Series 4: PD fat-sat · axial · left · 3.0mm · 0.50mm/px · z∈[-89,+39]mm · 8 of 33 slices shown]
[im 1/33]
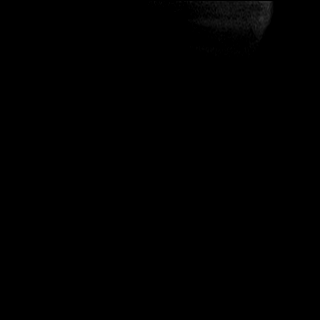
[im 5/33]
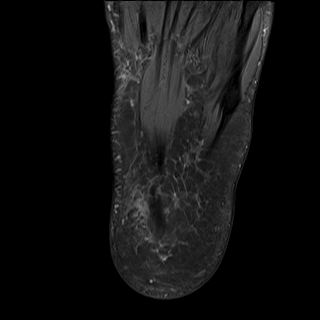
[im 10/33]
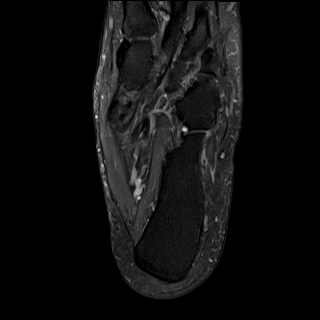
[im 14/33]
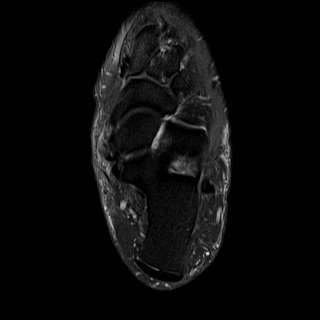
[im 19/33]
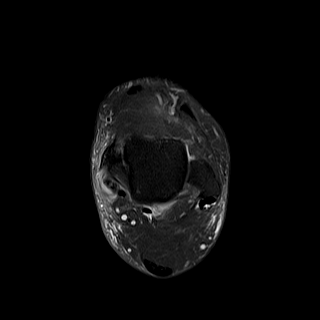
[im 23/33]
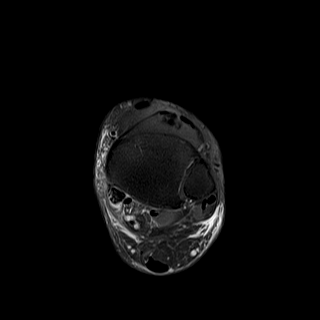
[im 28/33]
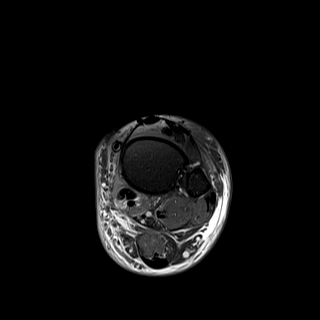
[im 33/33]
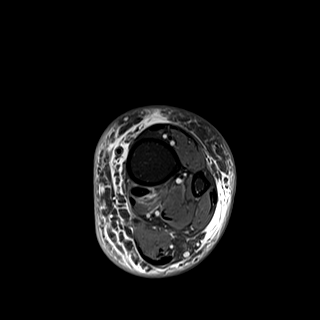

[Series 6: T2 fat-sat · coronal · left · 3.0mm · 0.62mm/px · 11 of 40 slices shown (1 of 2)]
[im 1/40]
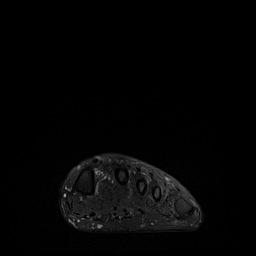
[im 4/40]
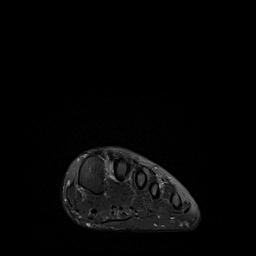
[im 8/40]
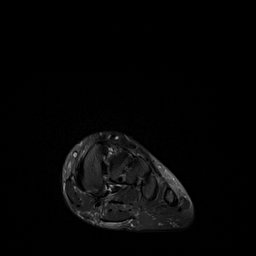
[im 12/40]
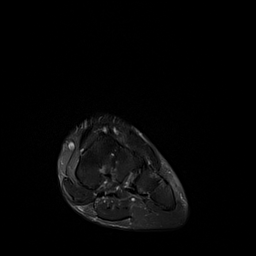
[im 16/40]
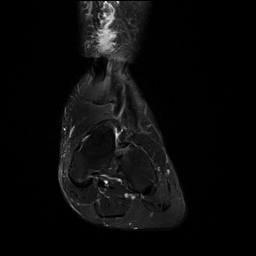
[im 20/40]
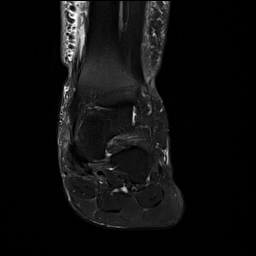
[im 24/40]
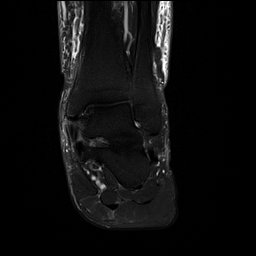
[im 28/40]
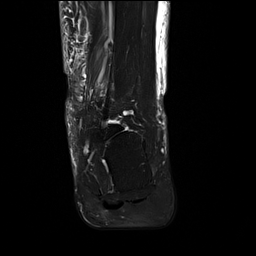
[im 32/40]
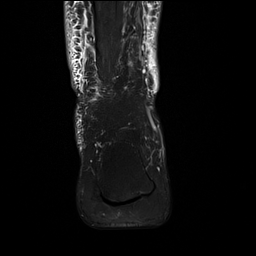
[im 36/40]
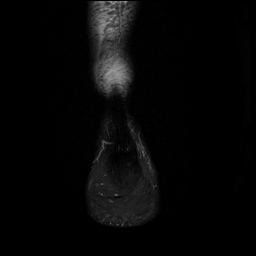
[im 40/40]
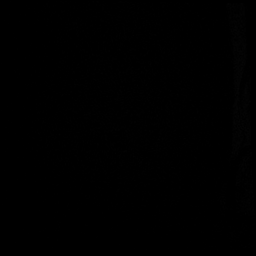

[Series 7: T1 · sagittal · left · 4.0mm · 0.70mm/px · 6 of 22 slices shown]
[im 1/22]
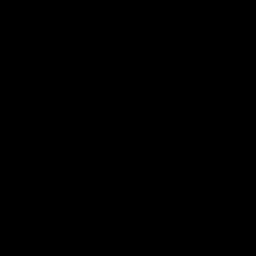
[im 5/22]
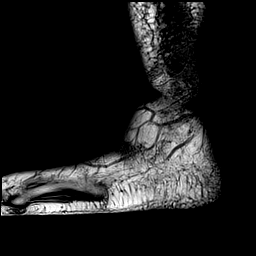
[im 9/22]
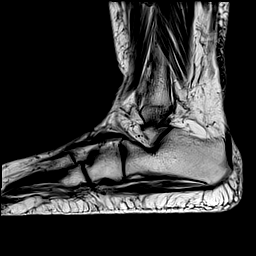
[im 13/22]
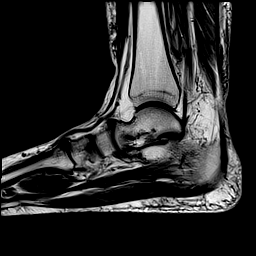
[im 17/22]
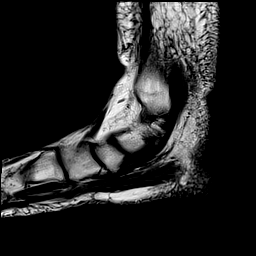
[im 22/22]
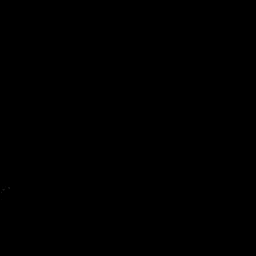

[Series 8: STIR · sagittal · left · 4.0mm · 0.35mm/px · 6 of 22 slices shown]
[im 1/22]
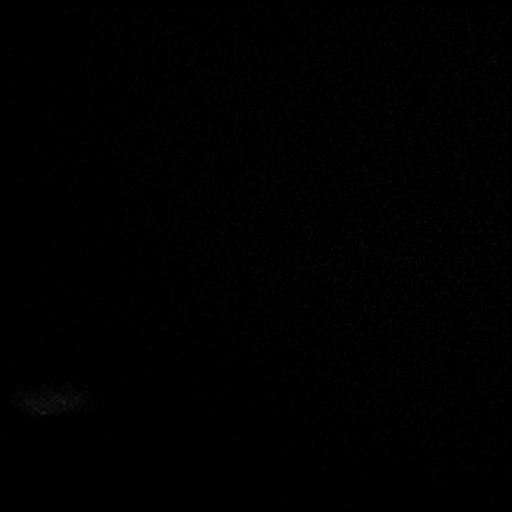
[im 5/22]
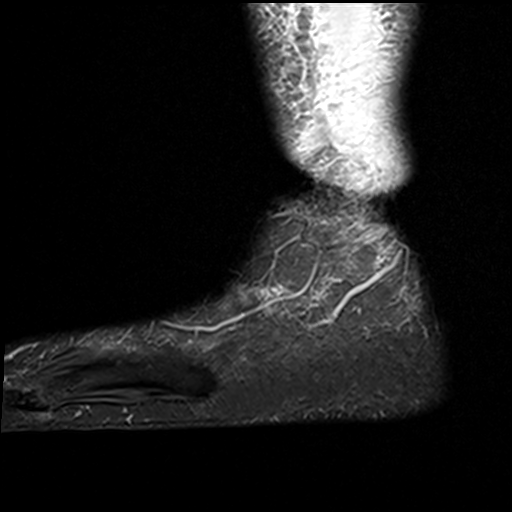
[im 9/22]
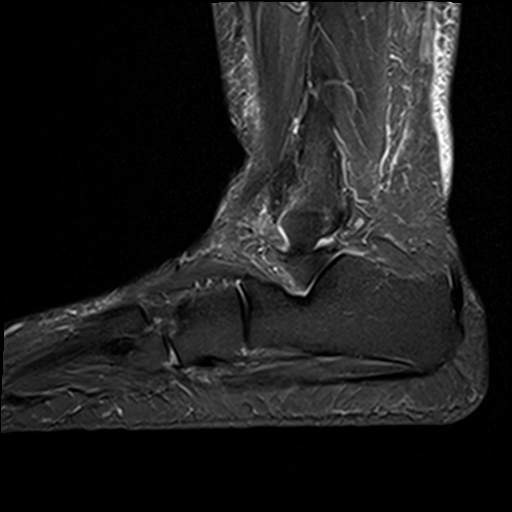
[im 13/22]
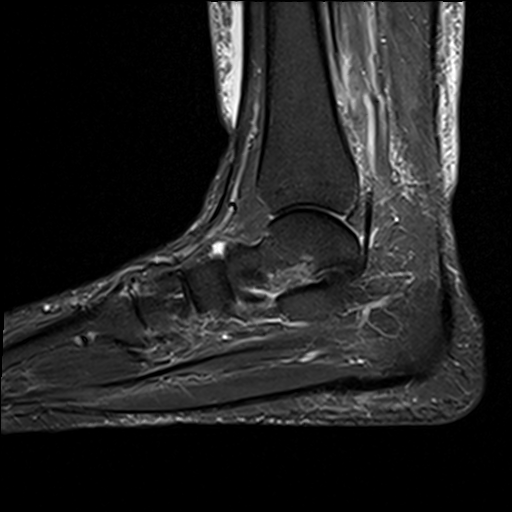
[im 17/22]
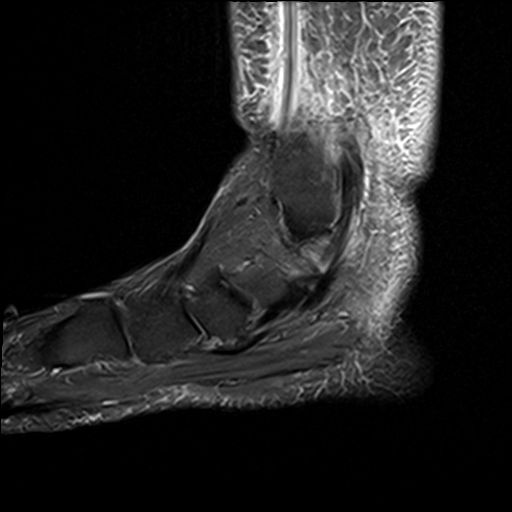
[im 22/22]
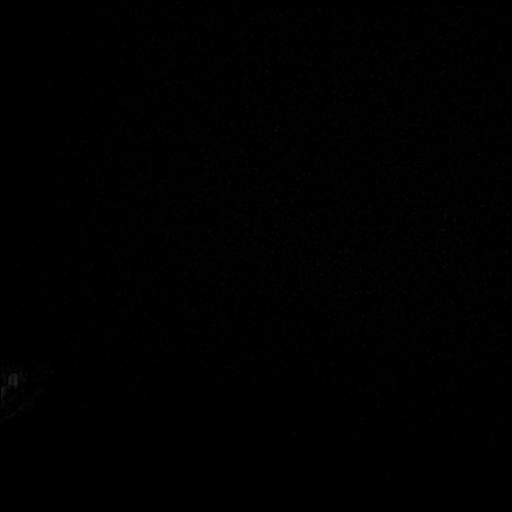

[Series 9: T2 fat-sat · axial · left · 3.0mm · 0.50mm/px · z∈[-91,+41]mm · 9 of 34 slices shown (2 of 2)]
[im 1/34]
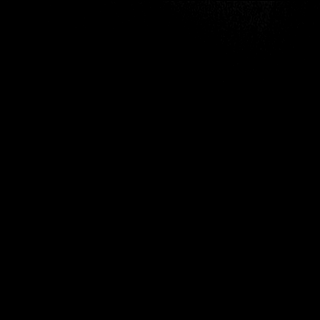
[im 5/34]
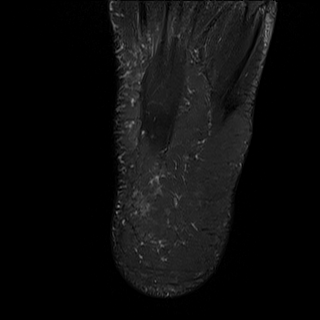
[im 9/34]
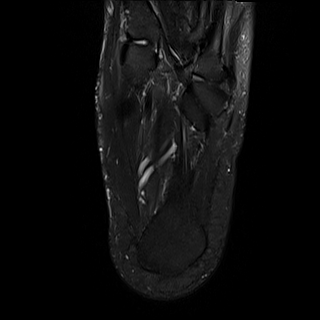
[im 13/34]
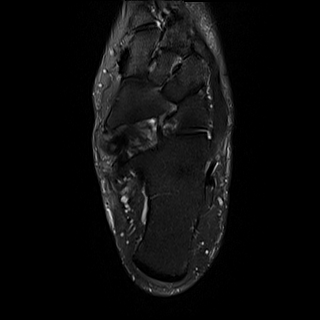
[im 17/34]
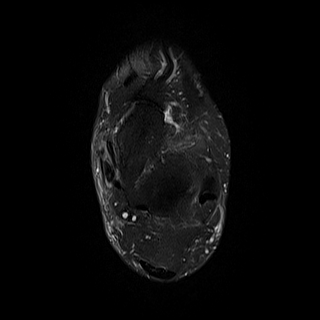
[im 21/34]
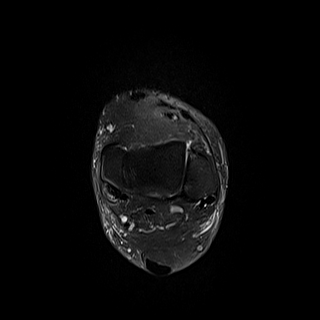
[im 25/34]
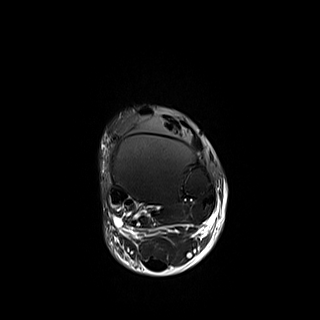
[im 29/34]
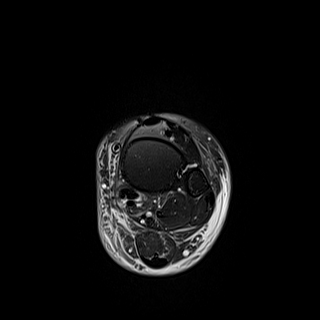
[im 34/34]
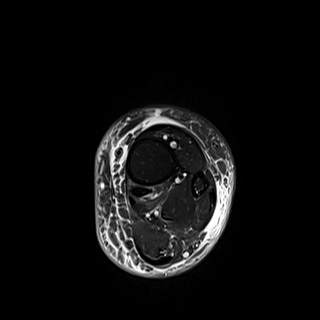

[40 of 40 positions shown; findings below may reference images not displayed]

FINDINGS: TENDONS

Peroneal: Mild peroneus longus tendinopathy adjacent to the distal
calcaneus.

Posteromedial: Irregular longitudinal and degenerative tearing of
the tibialis posterior with expansion and heterogeneity of the
tendon as well as severe distal tendinopathy and tenosynovitis.

Anterior: Unremarkable

Achilles: Unremarkable

Plantar Fascia: Moderate abnormal thickening of the medial band of
the plantar fascia with low-level edema in the adjacent plantar
calcaneal spur.

LIGAMENTS

Lateral: Unremarkable

Medial: Thickening of the superomedial band of the spring ligament.

CARTILAGE

Ankle Joint: Mild degenerative chondral thinning without focal
lesion identified.

Subtalar Joints/Sinus Tarsi: Unremarkable

Bones: Mild subcortical marrow edema and small degenerative
subcortical cyst between the middle cuneiform and the base of the
second metatarsal.

Other: No supplemental non-categorized findings.
IMPRESSION: 1. Irregular longitudinal in degenerative tearing of the tibialis
posterior tendon along with prominent distal tendinopathy and
tenosynovitis. The tibialis posterior is not completely ruptured.
There is adjacent thickening of the medial band of the plantar
fascia. Correlate clinically in assessing for tibialis posterior
dysfunction.
2. Mild peroneus longus tendinopathy adjacent to the distal
calcaneus.
3. Mild degenerative chondral thinning in the tibiotalar joint.
4. Moderate thickening of the medial band of the plantar fascia
compatible with plantar fasciitis.

## 2019-03-13 ENCOUNTER — Encounter: Payer: Self-pay | Admitting: Podiatry

## 2019-03-13 ENCOUNTER — Ambulatory Visit: Payer: Managed Care, Other (non HMO) | Admitting: Podiatry

## 2019-03-13 ENCOUNTER — Other Ambulatory Visit: Payer: Self-pay

## 2019-03-13 DIAGNOSIS — M722 Plantar fascial fibromatosis: Secondary | ICD-10-CM

## 2019-03-13 DIAGNOSIS — M76822 Posterior tibial tendinitis, left leg: Secondary | ICD-10-CM

## 2019-03-13 DIAGNOSIS — Z9889 Other specified postprocedural states: Secondary | ICD-10-CM | POA: Diagnosis not present

## 2019-03-13 NOTE — Progress Notes (Signed)
She presents today date of surgery October 26, 2018 status post endoscopic plantar fasciotomy left foot with Kidner procedure.  She is doing quite well she states that is good denies fever chills nausea vomiting muscle aches and pains as some swelling occasionally.  Objective: No erythema edema cellulitis drainage or odor.  Good functional inversion against resistance.  No pain on palpation.  Assessment: Well-healing surgical foot left.  Plan: Follow-up with me on an as-needed basis.

## 2019-04-16 ENCOUNTER — Encounter: Payer: Self-pay | Admitting: Nurse Practitioner

## 2019-04-16 ENCOUNTER — Ambulatory Visit: Payer: Managed Care, Other (non HMO) | Admitting: Family Medicine

## 2019-04-16 ENCOUNTER — Other Ambulatory Visit: Payer: Self-pay

## 2019-04-16 ENCOUNTER — Ambulatory Visit (INDEPENDENT_AMBULATORY_CARE_PROVIDER_SITE_OTHER): Payer: Managed Care, Other (non HMO) | Admitting: Nurse Practitioner

## 2019-04-16 VITALS — BP 130/76 | HR 88 | Temp 97.9°F | Resp 16 | Ht 65.0 in | Wt 220.6 lb

## 2019-04-16 DIAGNOSIS — R7301 Impaired fasting glucose: Secondary | ICD-10-CM | POA: Diagnosis not present

## 2019-04-16 DIAGNOSIS — E0590 Thyrotoxicosis, unspecified without thyrotoxic crisis or storm: Secondary | ICD-10-CM | POA: Diagnosis not present

## 2019-04-16 DIAGNOSIS — M79642 Pain in left hand: Secondary | ICD-10-CM

## 2019-04-16 DIAGNOSIS — Z720 Tobacco use: Secondary | ICD-10-CM

## 2019-04-16 DIAGNOSIS — I10 Essential (primary) hypertension: Secondary | ICD-10-CM | POA: Diagnosis not present

## 2019-04-16 DIAGNOSIS — M79641 Pain in right hand: Secondary | ICD-10-CM

## 2019-04-16 MED ORDER — MELOXICAM 7.5 MG PO TABS: 7.5000 mg | ORAL_TABLET | Freq: Every day | ORAL | 0 refills | Status: DC

## 2019-04-16 NOTE — Progress Notes (Signed)
Virtual Visit via Video Note  I connected with Laura Kent on 04/16/19 at 10:20 AM EDT by a video enabled telemedicine application and verified that I am speaking with the correct person using two identifiers.   Staff discussed the limitations of evaluation and management by telemedicine and the availability of in person appointments. The patient expressed understanding and agreed to proceed.  Patient location: home  My location: work office Other people present:  none HPI  Bilateral hand pain States has had bilateral hand pain over the past month since she has been working from home now and her computer from home has a different set up and old keyboard and desk. Pain is worse at the end of her work day and states sometimes hands feel swollen.   Hypertension rx amlodipine 5mg , valsartan 160mg  and HCTZ 25mg  daily  BP Readings from Last 3 Encounters:  04/16/19 130/76  11/26/18 (!) 144/76  10/16/18 122/80    Hyperthyroidism  Treated with radiation and did not become hypothroidi Lab Results  Component Value Date   TSH 2.850 10/16/2018    Prediabetes States eats some fruits and vegetables and daily. Cut down on carbs in quarantine.  Denies polyphagia, polydipsia, polyuria.  Lab Results  Component Value Date   HGBA1C 6.2 (H) 10/16/2018     PHQ2/9: Depression screen [facility] 2/9 04/16/2019 10/16/2018 01/25/2018 11/24/2017 04/24/2017  Decreased Interest 0 0 0 0 0  Down, Depressed, Hopeless 0 0 0 0 1  PHQ - 2 Score 0 0 0 0 1  Altered sleeping 0 0 - - -  Tired, decreased energy 0 0 - - -  Change in appetite 0 0 - - -  Feeling bad or failure about yourself  0 0 - - -  Trouble concentrating 0 0 - - -  Moving slowly or fidgety/restless 0 0 - - -  Suicidal thoughts 0 0 - - -  PHQ-9 Score 0 0 - - -  Difficult doing work/chores Not difficult at all Not difficult at all - - -     PHQ reviewed. Negative  Patient Active Problem List   Diagnosis Date Noted  . Tobacco abuse  11/24/2017  . Hyperthyroidism 04/30/2017  . Plantar fasciitis 04/21/2017  . Colon cancer screening 11/21/2016  . 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 Medication monitoring encounter 05/25/2016  . Preventative health care 11/16/2015  . Right arm pain 11/16/2015  . Screening for cervical cancer 11/16/2015  . Impaired fasting glucose 05/19/2015  . Benign hypertension 05/19/2015  . Obesity 05/19/2015    Past Medical History:  Diagnosis Date  . GERD (gastroesophageal reflux disease)   . Hypertension   . Hyperthyroidism    Treated with radioactive iodine 2014  . IFG (impaired fasting glucose)   . Menopausal syndrome   . Obesity   . Plantar fascial fibromatosis of left foot   . Reflux     Past Surgical History:  Procedure Laterality Date  . BREAST CYST ASPIRATION Right 2011  . BUNIONECTOMY    . CHOLECYSTECTOMY  Oct 2014  . COLONOSCOPY WITH PROPOFOL N/A 01/17/2017   Procedure: COLONOSCOPY WITH PROPOFOL;  Surgeon: Robert Bellow, MD;  Location: [facility] ENDOSCOPY;  Service: Endoscopy;  Laterality: N/A;  . POSTERIOR TIBIAL TENDON REPAIR Left 10/26/2018    Social History   Tobacco Use  . Smoking status: Current Every Day Smoker    Packs/day: 0.50    Years: 34.00    Pack years: 17.00    Types: Cigarettes  Last attempt to quit: 05/18/2012    Years since quitting: 6.9  . Smokeless tobacco: Never Used  Substance Use Topics  . Alcohol use: No     Current Outpatient Medications:  .  amLODipine (NORVASC) 5 MG tablet, Take 1 tablet (5 mg total) by mouth daily., Disp: 90 tablet, Rfl: 3 .  aspirin EC 81 MG tablet, Take 81 mg by mouth daily., Disp: , Rfl:  .  cholecalciferol (VITAMIN D) 1000 units tablet, Take 1,000 Units by mouth daily as needed., Disp: , Rfl:  .  hydrocortisone 2.5 % cream, Apply 1 application topically as needed. , Disp: , Rfl:  .  valsartan-hydrochlorothiazide (DIOVAN-HCT) 160-25 MG tablet, Take 1 tablet by mouth daily., Disp: 90 tablet, Rfl:  3 .  ondansetron (ZOFRAN) 4 MG tablet, Take 1 tablet (4 mg total) by mouth every 8 (eight) hours as needed for nausea or vomiting., Disp: 20 tablet, Rfl: 0 .  traMADol (ULTRAM) 50 MG tablet, Take 1 tablet (50 mg total) by mouth every 8 (eight) hours as needed., Disp: 30 tablet, Rfl: 0  No Known Allergies  ROS   No other specific complaints in a complete review of systems (except as listed in HPI above).  Objective  Vitals:   04/16/19 0937 04/16/19 1059  BP: (!) 141/75 130/76  Pulse: 100 88  Resp:  16  Temp: 97.9 F (36.6 C)   TempSrc: Oral   Weight: 220 lb 9.6 oz (100.1 kg)   Height: 5\' 5"  (1.651 m)      Body mass index is 36.71 kg/m.  Nursing Note and Vital Signs reviewed.  Physical Exam   Constitutional: Patient appears well-developed and well-nourished. No distress.  HENT: Head: Normocephalic and atraumatic. Cardiovascular: Normal rate Pulmonary/Chest: Effort normal  Musculoskeletal: no obvious deformities or bruising noted. Mild swelling at knuckles, non-tender.  Neurological: alert and oriented, speech normal.  Skin: No rash noted. No erythema.  Psychiatric: Patient has a normal mood and affect. behavior is normal. Judgment and thought content normal.    Assessment & Plan  1. Benign hypertension Mildly elevated patient thinks this is due to stress of appointment. Rechecked after she got off phone with CMA and improved. Will monitor 2-4 times a week and cosistently elevated aboive 140/90 will call and schedule 1 month follow up. DASH guidelines discussed.   2. Impaired fasting glucose Stable, discussed healthy diet in quarantine   3. Hyperthyroidism Last levels euthyroid patient denies symptoms,   4. Tobacco abuse Still smoking half pack a day   5. Bilateral hand pain Discussed work posture, recommend foam bar for typing, wrist splints at night. Discussed NSAIDs short term. Low sodium diet  - meloxicam (MOBIC) 7.5 MG tablet; Take 1 tablet (7.5 mg  total) by mouth daily.  Dispense: 30 tablet; Refill: 0   Follow Up Instructions:  4 month routine, 1 month if she is getting BP consistently over 140/90   I discussed the assessment and treatment plan with the patient. The patient was provided an opportunity to ask questions and all were answered. The patient agreed with the plan and demonstrated an understanding of the instructions.   The patient was advised to call back or seek an in-person evaluation if the symptoms worsen or if the condition fails to improve as anticipated.  I provided 24 minutes of non-face-to-face time during this encounter.   Fredderick Severance, NP

## 2019-08-19 ENCOUNTER — Other Ambulatory Visit: Payer: Self-pay

## 2019-08-19 ENCOUNTER — Ambulatory Visit: Payer: Managed Care, Other (non HMO) | Admitting: Family Medicine

## 2019-08-19 ENCOUNTER — Encounter: Payer: Self-pay | Admitting: Family Medicine

## 2019-08-19 ENCOUNTER — Ambulatory Visit
Admission: RE | Admit: 2019-08-19 | Discharge: 2019-08-19 | Disposition: A | Discharge status: Home / Self Care | Payer: Managed Care, Other (non HMO) | Source: Ambulatory Visit | Attending: Family Medicine | Admitting: Family Medicine

## 2019-08-19 ENCOUNTER — Ambulatory Visit
Admission: RE | Admit: 2019-08-19 | Discharge: 2019-08-19 | Disposition: A | Discharge status: Home / Self Care | Payer: Managed Care, Other (non HMO) | Attending: Family Medicine | Admitting: Family Medicine

## 2019-08-19 VITALS — BP 130/82 | HR 100 | Temp 97.9°F | Resp 14 | Ht 65.0 in | Wt 216.7 lb

## 2019-08-19 DIAGNOSIS — R7303 Prediabetes: Secondary | ICD-10-CM | POA: Insufficient documentation

## 2019-08-19 DIAGNOSIS — E079 Disorder of thyroid, unspecified: Secondary | ICD-10-CM | POA: Diagnosis not present

## 2019-08-19 DIAGNOSIS — I10 Essential (primary) hypertension: Secondary | ICD-10-CM | POA: Diagnosis not present

## 2019-08-19 DIAGNOSIS — M25541 Pain in joints of right hand: Secondary | ICD-10-CM

## 2019-08-19 DIAGNOSIS — M25542 Pain in joints of left hand: Secondary | ICD-10-CM

## 2019-08-19 DIAGNOSIS — M7989 Other specified soft tissue disorders: Secondary | ICD-10-CM

## 2019-08-19 DIAGNOSIS — Z6836 Body mass index (BMI) 36.0-36.9, adult: Secondary | ICD-10-CM

## 2019-08-19 DIAGNOSIS — Z13220 Encounter for screening for lipoid disorders: Secondary | ICD-10-CM

## 2019-08-19 DIAGNOSIS — F17200 Nicotine dependence, unspecified, uncomplicated: Secondary | ICD-10-CM

## 2019-08-19 MED ORDER — AMLODIPINE BESYLATE 5 MG PO TABS: 5.0000 mg | ORAL_TABLET | Freq: Every day | ORAL | 3 refills | Status: DC

## 2019-08-19 MED ORDER — VALSARTAN-HYDROCHLOROTHIAZIDE 160-25 MG PO TABS: 1.0000 | ORAL_TABLET | Freq: Every day | ORAL | 3 refills | Status: DC

## 2019-08-19 NOTE — Progress Notes (Signed)
Name: Laura Kent   MRN: [id number]    DOB: 02/27/1951   Date:08/19/2019       Progress Note  Chief Complaint  Patient presents with  . Follow-up  . Hypertension  . Medication Refill     Subjective:   Laura Kent is a 68 y.o. female, presents to clinic for routine follow up on the conditions listed above.  She has acute on chronic complaint of intermittent pain, swelling and decreased ROM to both hands and fingers 1-3, Joint pain in both hands 2nd and 3rd MCP joints and 1-3rd fingers in general swollen and decreased ROM, has been on and off for years and much worse this year.  Worse in the morning has difficulty making a fist.  Feels her 2nd and 3rd mcp joint knuckle is chronically enlarged.  No other joint pain or swelling.  No family hx.  Hypertension:  Currently managed on Norvasc, valsartan and hydrochlorothiazide, she takes daily, reports excellent med compliance and denies any SE.  No lightheadedness, hypotension, syncope, CP, SOB, exertional sx, LE edema, palpitation, Ha's, visual disturbances Blood pressure today is well controlled and at goal BP Readings from Last 3 Encounters:  08/19/19 130/82  04/16/19 130/76  11/26/18 (!) 144/76   Obesity:  BMI 36.06 Patient has been loosing weight - BMI 36 Working hard to get up an move at home, working on diet - certain foods flair up hand swelling and joint pain Hot dogs, certain yeast breads, processed meats - hot dogs, lunch meats and salt  Wt Readings from Last 5 Encounters:  08/19/19 216 lb 11.2 oz (98.3 kg)  04/16/19 220 lb 9.6 oz (100.1 kg)  10/16/18 222 lb 12.8 oz (101.1 kg)  01/25/18 229 lb (103.9 kg)  11/24/17 234 lb 6.4 oz (106.3 kg)   BMI Readings from Last 5 Encounters:  08/19/19 36.06 kg/m  04/16/19 36.71 kg/m  10/16/18 37.08 kg/m  01/25/18 37.82 kg/m  11/24/17 39.01 kg/m   Current smoker, patient has no desire to quit smoking at this time.  Reviewed counseling and medications to help but she  declines   Patient Active Problem List   Diagnosis Date Noted  . Tobacco abuse 11/24/2017  . Hyperthyroidism 04/30/2017  . Plantar fasciitis 04/21/2017  . Colon cancer screening 11/21/2016  . 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 Medication monitoring encounter 05/25/2016  . Preventative health care 11/16/2015  . Right arm pain 11/16/2015  . Screening for cervical cancer 11/16/2015  . Impaired fasting glucose 05/19/2015  . Benign hypertension 05/19/2015  . Obesity 05/19/2015    Past Surgical History:  Procedure Laterality Date  . BREAST CYST ASPIRATION Right 2011  . BUNIONECTOMY    . CHOLECYSTECTOMY  Oct 2014  . COLONOSCOPY WITH PROPOFOL N/A 01/17/2017   Procedure: COLONOSCOPY WITH PROPOFOL;  Surgeon: Robert Bellow, MD;  Location: [facility] ENDOSCOPY;  Service: Endoscopy;  Laterality: N/A;  . POSTERIOR TIBIAL TENDON REPAIR Left 10/26/2018    Family History  Problem Relation Age of Onset  . Diabetes Mother   . Kidney disease Mother   . Heart disease Mother   . Hypertension Mother   . Diabetes Father   . Heart disease Father   . Hypertension Father   . Cancer Brother        leukemia  . Heart disease Brother        tachycardia  . Stroke Brother        fatal  . Diabetes Sister   .  Colon cancer Maternal Aunt   . Diabetes Maternal Grandmother   . Breast cancer Neg Hx     Social History   Socioeconomic History  . Marital status: Married    Spouse name: Not on file  . Number of children: Not on file  . Years of education: Not on file  . Highest education level: Not on file  Occupational History  . Not on file  Social Needs  . Financial resource strain: Not on file  . Food insecurity    Worry: Not on file    Inability: Not on file  . Transportation needs    Medical: Not on file    Non-medical: Not on file  Tobacco Use  . Smoking status: Current Every Day Smoker    Packs/day: 0.50    Years: 34.00    Pack years: 17.00    Types: Cigarettes     Last attempt to quit: 05/18/2012    Years since quitting: 7.2  . Smokeless tobacco: Never Used  Substance and Sexual Activity  . Alcohol use: No  . Drug use: No  . Sexual activity: Not Currently  Lifestyle  . Physical activity    Days per week: Not on file    Minutes per session: Not on file  . Stress: Not on file  Relationships  . Social Herbalist on phone: Not on file    Gets together: Not on file    Attends religious service: Not on file    Active member of club or organization: Not on file    Attends meetings of clubs or organizations: Not on file    Relationship status: Not on file  . Intimate partner violence    Fear of current or ex partner: Not on file    Emotionally abused: Not on file    Physically abused: Not on file    Forced sexual activity: Not on file  Other Topics Concern  . Not on file  Social History Narrative  . Not on file     Current Outpatient Medications:  .  amLODipine (NORVASC) 5 MG tablet, Take 1 tablet (5 mg total) by mouth daily., Disp: 90 tablet, Rfl: 3 .  aspirin EC 81 MG tablet, Take 81 mg by mouth daily., Disp: , Rfl:  .  cholecalciferol (VITAMIN D) 1000 units tablet, Take 1,000 Units by mouth daily as needed., Disp: , Rfl:  .  Turmeric 500 MG CAPS, Take by mouth., Disp: , Rfl:  .  valsartan-hydrochlorothiazide (DIOVAN-HCT) 160-25 MG tablet, Take 1 tablet by mouth daily., Disp: 90 tablet, Rfl: 3 .  hydrocortisone 2.5 % cream, Apply 1 application topically as needed. , Disp: , Rfl:  .  meloxicam (MOBIC) 7.5 MG tablet, Take 1 tablet (7.5 mg total) by mouth daily. (Patient not taking: Reported on 08/19/2019), Disp: 30 tablet, Rfl: 0 .  ondansetron (ZOFRAN) 4 MG tablet, Take 1 tablet (4 mg total) by mouth every 8 (eight) hours as needed for nausea or vomiting. (Patient not taking: Reported on 08/19/2019), Disp: 20 tablet, Rfl: 0 .  traMADol (ULTRAM) 50 MG tablet, Take 1 tablet (50 mg total) by mouth every 8 (eight) hours as needed. (Patient  not taking: Reported on 08/19/2019), Disp: 30 tablet, Rfl: 0  No Known Allergies  I personally reviewed active problem list, medication list, allergies, family history, social history, health maintenance, notes from last encounter, lab results with the patient/caregiver today.  Review of Systems  Constitutional: Negative.   HENT: Negative.  Eyes: Negative.   Respiratory: Negative.   Cardiovascular: Negative.   Gastrointestinal: Negative.   Endocrine: Negative.   Genitourinary: Negative.   Musculoskeletal: Negative.   Skin: Negative.   Allergic/Immunologic: Negative.   Neurological: Negative.   Hematological: Negative.   Psychiatric/Behavioral: Negative.   All other systems reviewed and are negative.    Objective:    Vitals:   08/19/19 1127  BP: 130/82  Pulse: 100  Resp: 14  Temp: 97.9 F (36.6 C)  SpO2: 94%  Weight: 216 lb 11.2 oz (98.3 kg)  Height: 5\' 5"  (1.651 m)    Body mass index is 36.06 kg/m.  Physical Exam Vitals signs and nursing note reviewed.  Constitutional:      General: She is not in acute distress.    Appearance: Normal appearance. She is well-developed. She is obese. She is not ill-appearing, toxic-appearing or diaphoretic.  HENT:     Head: Normocephalic and atraumatic.     Right Ear: External ear normal.     Left Ear: External ear normal.  Eyes:     General: No scleral icterus.       Right eye: No discharge.        Left eye: No discharge.     Conjunctiva/sclera: Conjunctivae normal.     Pupils: Pupils are equal, round, and reactive to light.  Neck:     Musculoskeletal: Normal range of motion and neck supple.     Trachea: No tracheal deviation.  Cardiovascular:     Rate and Rhythm: Normal rate and regular rhythm.     Pulses: Normal pulses.     Heart sounds: Normal heart sounds. No murmur. No friction rub. No gallop.   Pulmonary:     Effort: Pulmonary effort is normal. No respiratory distress.     Breath sounds: Normal breath sounds. No  stridor. No wheezing or rales.  Chest:     Chest wall: No tenderness.  Abdominal:     General: Bowel sounds are normal. There is no distension.     Palpations: Abdomen is soft.     Tenderness: There is no abdominal tenderness. There is no guarding or rebound.  Musculoskeletal: Normal range of motion.     Right lower leg: Edema present.     Left lower leg: Edema present.  Lymphadenopathy:     Cervical: No cervical adenopathy.  Skin:    General: Skin is warm and dry.     Capillary Refill: Capillary refill takes less than 2 seconds.     Coloration: Skin is not jaundiced or pale.     Findings: No rash.  Neurological:     Mental Status: She is alert and oriented to person, place, and time.     Motor: No abnormal muscle tone.     Coordination: Coordination normal.  Psychiatric:        Mood and Affect: Mood normal.        Behavior: Behavior normal.        Thought Content: Thought content normal.        PHQ2/9: Depression screen [facility] 2/9 08/19/2019 04/16/2019 10/16/2018 01/25/2018 11/24/2017  Decreased Interest 0 0 0 0 0  Down, Depressed, Hopeless 1 0 0 0 0  PHQ - 2 Score 1 0 0 0 0  Altered sleeping 0 0 0 - -  Tired, decreased energy 0 0 0 - -  Change in appetite 0 0 0 - -  Feeling bad or failure about yourself  0 0 0 - -  Trouble concentrating  0 0 0 - -  Moving slowly or fidgety/restless 0 0 0 - -  Suicidal thoughts 0 0 0 - -  PHQ-9 Score 1 0 0 - -  Difficult doing work/chores Not difficult at all Not difficult at all Not difficult at all - -    phq 9 is negative Reviewed today   Fall Risk: Fall Risk  08/19/2019 04/16/2019 10/16/2018 01/25/2018 11/24/2017  Falls in the past year? 1 0 0 No No  Number falls in past yr: 1 - 0 - -  Injury with Fall? 0 - - - -      Functional Status Survey: Is the patient deaf or have difficulty hearing?: No Does the patient have difficulty seeing, even when wearing glasses/contacts?: No Does the patient have difficulty concentrating,  remembering, or making decisions?: No Does the patient have difficulty walking or climbing stairs?: No Does the patient have difficulty dressing or bathing?: No Does the patient have difficulty doing errands alone such as visiting a doctor's office or shopping?: No    Assessment & Plan:     ICD-10-CM   1. Benign hypertension  I10 valsartan-hydrochlorothiazide (DIOVAN-HCT) 160-25 MG tablet    amLODipine (NORVASC) 5 MG tablet    Comprehensive metabolic panel    Comprehensive metabolic panel    DISCONTINUED: valsartan-hydrochlorothiazide (DIOVAN-HCT) 160-25 MG tablet    DISCONTINUED: amLODipine (NORVASC) 5 MG tablet    CANCELED: COMPLETE METABOLIC PANEL WITH GFR   well controlled with current meds, recheck labs, 6 month f/up  2. Prediabetes  R73.03 Comprehensive metabolic panel    Hemoglobin A1c    Hemoglobin A1c    Comprehensive metabolic panel   loosing weight, working on diet and exercise, recheck A1C  3. Thyroid disease  E07.9 TSH    TSH    CANCELED: T4, free    CANCELED: TSH   hx of, some intermittent swelling, no other sx, will recheck TSH  4. Class 2 severe obesity with serious comorbidity and body mass index (BMI) of 36.0 to 36.9 in adult, unspecified obesity type (HCC)  E66.01 Comprehensive metabolic panel   [id number] Lipid panel    Hemoglobin A1c    TSH    TSH    Hemoglobin A1c    Lipid panel    Comprehensive metabolic panel    CANCELED: Lipid panel   improving weight and BMI over the past two years  5. Arthralgia of both hands  M25.541 DG Hand Complete Left   M25.542 DG Hand Complete Right    Sedimentation rate    C-reactive protein    Uric acid    Uric acid    C-reactive protein    Sedimentation rate    CANCELED: Sedimentation rate    CANCELED: C-reactive protein    CANCELED: T4, free    CANCELED: TSH   r/o gout, checking inflammatory markers and Xrays, may be OA?  labs and xray  6. Bilateral hand swelling  M79.89 DG Hand Complete Left    DG Hand Complete  Right    TSH    Sedimentation rate    C-reactive protein    Uric acid    Uric acid    C-reactive protein    Sedimentation rate    TSH    CANCELED: Sedimentation rate    CANCELED: C-reactive protein    CANCELED: T4, free    CANCELED: TSH  7. Screening for lipoid disorders  Z13.220 Lipid panel    Lipid panel    CANCELED: Lipid panel  8.  Current smoker  F17.200    no desire to quit smoking, discussed but pt declined   Discussed smoking, pt has no desire to quit or right now to do low dose CT lung cancer screening.  Return in about 6 months (around 02/16/2020) for Routine follow-up monitor prediabetes/HTN.   Delsa Grana, PA-C 08/19/19 11:39 AM

## 2019-08-19 NOTE — Patient Instructions (Addendum)
Calcium 1200 mg a day and Vit D 3 1000 IU per day for healthy bones  Let me know if you want to do the low dose CT screening for lung cancer  I will call you with your lab results and Xray results

## 2019-08-20 LAB — COMPREHENSIVE METABOLIC PANEL
ALT: 15 IU/L (ref 0–32)
AST: 13 IU/L (ref 0–40)
Albumin/Globulin Ratio: 1.9 (ref 1.2–2.2)
Albumin: 4.2 g/dL (ref 3.8–4.8)
Alkaline Phosphatase: 90 IU/L (ref 39–117)
BUN/Creatinine Ratio: 18 (ref 12–28)
BUN: 14 mg/dL (ref 8–27)
Bilirubin Total: 0.3 mg/dL (ref 0.0–1.2)
CO2: 26 mmol/L (ref 20–29)
Calcium: 10 mg/dL (ref 8.7–10.3)
Chloride: 100 mmol/L (ref 96–106)
Creatinine, Ser: 0.77 mg/dL (ref 0.57–1.00)
GFR calc Af Amer: 93 mL/min/{1.73_m2} (ref >59)
GFR calc non Af Amer: 81 mL/min/{1.73_m2} (ref >59)
Globulin, Total: 2.2 g/dL (ref 1.5–4.5)
Glucose: 106 mg/dL — ABNORMAL HIGH (ref 65–99)
Potassium: 3.7 mmol/L (ref 3.5–5.2)
Sodium: 140 mmol/L (ref 134–144)
Total Protein: 6.4 g/dL (ref 6.0–8.5)

## 2019-08-20 LAB — LIPID PANEL
Chol/HDL Ratio: 2.6 ratio (ref 0.0–4.4)
Cholesterol, Total: 111 mg/dL (ref 100–199)
HDL: 43 mg/dL (ref >39)
LDL Chol Calc (NIH): 53 mg/dL (ref 0–99)
Triglycerides: 69 mg/dL (ref 0–149)
VLDL Cholesterol Cal: 15 mg/dL (ref 5–40)

## 2019-08-20 LAB — HEMOGLOBIN A1C
Est. average glucose Bld gHb Est-mCnc: 128 mg/dL
Hgb A1c MFr Bld: 6.1 % — ABNORMAL HIGH (ref 4.8–5.6)

## 2019-08-20 LAB — URIC ACID: Uric Acid: 5.5 mg/dL (ref 2.5–7.1)

## 2019-08-20 LAB — TSH: TSH: 3.37 u[IU]/mL (ref 0.450–4.500)

## 2019-08-20 LAB — C-REACTIVE PROTEIN: CRP: 8 mg/L (ref 0–10)

## 2019-08-20 LAB — SEDIMENTATION RATE: Sed Rate: 28 mm/hr (ref 0–40)

## 2020-02-17 ENCOUNTER — Other Ambulatory Visit: Payer: Self-pay

## 2020-02-17 ENCOUNTER — Ambulatory Visit: Payer: Managed Care, Other (non HMO) | Admitting: Family Medicine

## 2020-02-17 ENCOUNTER — Encounter: Payer: Self-pay | Admitting: Family Medicine

## 2020-02-17 VITALS — BP 138/88 | HR 100 | Temp 97.8°F | Resp 16 | Ht 65.0 in | Wt 226.1 lb

## 2020-02-17 DIAGNOSIS — Z720 Tobacco use: Secondary | ICD-10-CM

## 2020-02-17 DIAGNOSIS — Z23 Encounter for immunization: Secondary | ICD-10-CM

## 2020-02-17 DIAGNOSIS — I10 Essential (primary) hypertension: Secondary | ICD-10-CM | POA: Diagnosis not present

## 2020-02-17 DIAGNOSIS — R7303 Prediabetes: Secondary | ICD-10-CM | POA: Diagnosis not present

## 2020-02-17 DIAGNOSIS — Z1231 Encounter for screening mammogram for malignant neoplasm of breast: Secondary | ICD-10-CM

## 2020-02-17 DIAGNOSIS — Z5181 Encounter for therapeutic drug level monitoring: Secondary | ICD-10-CM

## 2020-02-17 DIAGNOSIS — Z6836 Body mass index (BMI) 36.0-36.9, adult: Secondary | ICD-10-CM

## 2020-02-17 NOTE — Progress Notes (Signed)
Name: Laura Kent   MRN: [id number]    DOB: Dec 25, 1950   Date:02/17/2020       Progress Note  Chief Complaint  Patient presents with  . Follow-up  . Pre-Diabetic  . Hypertension  . Hyperthyroidism     Subjective:   Laura Kent is a 69 y.o. female, presents to clinic for routine follow up on the conditions listed above.  Hypertension:  Currently managed on valsartan HCTZ and amlodipine  Pt reports good med compliance and denies any SE.  No lightheadedness, hypotension, syncope. Blood pressure today is near goal controlled. BP Readings from Last 3 Encounters:  02/17/20 138/88  08/19/19 130/82  04/16/19 130/76  Pt denies CP, SOB, exertional sx, LE edema, palpitation, Ha's, visual disturbances Dietary efforts for BP?  None right now  Prediabetes: Not on any medication and she doesn't want to start any meds for it.  She has gained some weight and has not been very active with Covid pandemic surge and shut down also with winter weather. Denies: Polyuria, polydipsia, polyphagia, vision changes, or neuropathy Recent pertinent labs: Lab Results  Component Value Date   HGBA1C 6.1 (H) 08/19/2019   HGBA1C 6.2 (H) 10/16/2018   HGBA1C 6.4 (H) 11/24/2017   Smoking:  Patient previously was smoke about half pack per day of cigarettes but this has increased about 1 pack/day with over 30 years of smoking history.  She was doing better in the past she had even tried Chantix give her crazy dreams that she was able to stop smoking.  She reports that unfortunately she was around the wrong people and picked up smoking again.  She currently does not like she could stop but will try to cut back again to less than a pack per day. Smoking cessation instruction/counseling given:  counseled patient on the dangers of tobacco use, advised patient to stop smoking, and reviewed strategies to maximize success    Patient Active Problem List   Diagnosis Date Noted  . Prediabetes 08/19/2019  .  Tobacco abuse 11/24/2017  . Hyperthyroidism 04/30/2017  . Plantar fasciitis 04/21/2017  . Colon cancer screening 11/21/2016  . 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 Medication monitoring encounter 05/25/2016  . Preventative health care 11/16/2015  . Right arm pain 11/16/2015  . Screening for cervical cancer 11/16/2015  . Impaired fasting glucose 05/19/2015  . Benign hypertension 05/19/2015  . Obesity 05/19/2015    Past Surgical History:  Procedure Laterality Date  . BREAST CYST ASPIRATION Right 2011  . BUNIONECTOMY    . CHOLECYSTECTOMY  Oct 2014  . COLONOSCOPY WITH PROPOFOL N/A 01/17/2017   Procedure: COLONOSCOPY WITH PROPOFOL;  Surgeon: Robert Bellow, MD;  Location: [facility] ENDOSCOPY;  Service: Endoscopy;  Laterality: N/A;  . POSTERIOR TIBIAL TENDON REPAIR Left 10/26/2018    Family History  Problem Relation Age of Onset  . Diabetes Mother   . Kidney disease Mother   . Heart disease Mother   . Hypertension Mother   . Diabetes Father   . Heart disease Father   . Hypertension Father   . Cancer Brother        leukemia  . Heart disease Brother        tachycardia  . Stroke Brother        fatal  . Diabetes Sister   . Colon cancer Maternal Aunt   . Diabetes Maternal Grandmother   . Breast cancer Neg Hx     Social History   Tobacco Use  .  Smoking status: Current Every Day Smoker    Packs/day: 1.00    Years: 34.00    Pack years: 34.00    Types: Cigarettes    Last attempt to quit: 05/18/2012    Years since quitting: 7.7  . Smokeless tobacco: Never Used  Substance Use Topics  . Alcohol use: No  . Drug use: No      Current Outpatient Medications:  .  amLODipine (NORVASC) 5 MG tablet, Take 1 tablet (5 mg total) by mouth daily., Disp: 90 tablet, Rfl: 3 .  aspirin EC 81 MG tablet, Take 81 mg by mouth daily., Disp: , Rfl:  .  Calcium Carbonate-Vitamin D 600-125 MG-UNIT TABS, Take by mouth daily., Disp: , Rfl:  .  cholecalciferol (VITAMIN D) 1000 units  tablet, Take 1,000 Units by mouth daily as needed., Disp: , Rfl:  .  Turmeric 500 MG CAPS, Take by mouth., Disp: , Rfl:  .  valsartan-hydrochlorothiazide (DIOVAN-HCT) 160-25 MG tablet, Take 1 tablet by mouth daily., Disp: 90 tablet, Rfl: 3  No Known Allergies  Chart Review Today: I personally reviewed active problem list, medication list, allergies, family history, social history, health maintenance, notes from last encounter, lab results, imaging with the patient/caregiver today.   Review of Systems  10 Systems reviewed and are negative for acute change except as noted in the HPI.    Objective:    Vitals:   02/17/20 1057  BP: 138/88  Pulse: 100  Resp: 16  Temp: 97.8 F (36.6 C)  TempSrc: Temporal  SpO2: 98%  Weight: 226 lb 1.6 oz (102.6 kg)  Height: 5\' 5"  (1.651 m)    Body mass index is 37.63 kg/m.  Physical Exam Vitals and nursing note reviewed.  Constitutional:      General: She is not in acute distress.    Appearance: Normal appearance. She is well-developed. She is obese. She is not ill-appearing, toxic-appearing or diaphoretic.     Interventions: Face mask in place.  HENT:     Head: Normocephalic and atraumatic.     Right Ear: External ear normal.     Left Ear: External ear normal.  Eyes:     General: Lids are normal. No scleral icterus.       Right eye: No discharge.        Left eye: No discharge.     Conjunctiva/sclera: Conjunctivae normal.  Neck:     Trachea: Phonation normal. No tracheal deviation.  Cardiovascular:     Rate and Rhythm: Normal rate and regular rhythm.     Pulses: Normal pulses.          Radial pulses are 2+ on the right side and 2+ on the left side.       Posterior tibial pulses are 2+ on the right side and 2+ on the left side.     Heart sounds: Normal heart sounds. No murmur. No friction rub. No gallop.   Pulmonary:     Effort: Pulmonary effort is normal. No respiratory distress.     Breath sounds: No stridor. Rhonchi present. No  wheezing or rales.  Chest:     Chest wall: No tenderness.  Abdominal:     General: Bowel sounds are normal. There is no distension.     Palpations: Abdomen is soft.  Musculoskeletal:        General: No deformity. Normal range of motion.     Cervical back: Normal range of motion and neck supple.     Right lower leg: No  edema.     Left lower leg: No edema.  Lymphadenopathy:     Cervical: No cervical adenopathy.  Skin:    General: Skin is warm and dry.     Capillary Refill: Capillary refill takes less than 2 seconds.     Coloration: Skin is not jaundiced or pale.     Findings: No rash.  Neurological:     Mental Status: She is alert and oriented to person, place, and time.     Motor: No abnormal muscle tone.     Gait: Gait normal.  Psychiatric:        Mood and Affect: Mood normal.        Speech: Speech normal.        Behavior: Behavior normal.       PHQ2/9: Depression screen [facility] 2/9 02/17/2020 08/19/2019 04/16/2019 10/16/2018 01/25/2018  Decreased Interest 0 0 0 0 0  Down, Depressed, Hopeless 0 1 0 0 0  PHQ - 2 Score 0 1 0 0 0  Altered sleeping 0 0 0 0 -  Tired, decreased energy 0 0 0 0 -  Change in appetite 0 0 0 0 -  Feeling bad or failure about yourself  0 0 0 0 -  Trouble concentrating 0 0 0 0 -  Moving slowly or fidgety/restless 0 0 0 0 -  Suicidal thoughts 0 0 0 0 -  PHQ-9 Score 0 1 0 0 -  Difficult doing work/chores Not difficult at all Not difficult at all Not difficult at all Not difficult at all -    phq 9 is neg, reviewed  Fall Risk: Fall Risk  02/17/2020 08/19/2019 04/16/2019 10/16/2018 01/25/2018  Falls in the past year? 0 1 0 0 No  Number falls in past yr: 0 1 - 0 -  Injury with Fall? 0 0 - - -    Functional Status Survey: Is the patient deaf or have difficulty hearing?: No Does the patient have difficulty seeing, even when wearing glasses/contacts?: No Does the patient have difficulty concentrating, remembering, or making decisions?: No Does the patient  have difficulty walking or climbing stairs?: No Does the patient have difficulty dressing or bathing?: No Does the patient have difficulty doing errands alone such as visiting a doctor's office or shopping?: No   Assessment & Plan:     ICD-10-CM   1. Benign hypertension  I10 Comprehensive metabolic panel    Hemoglobin A1c    Comprehensive metabolic panel    CANCELED: COMPLETE METABOLIC PANEL WITH GFR    CANCELED: Hemoglobin A1c    CANCELED: Hemoglobin A1c   Uncontrolled not currently at goal encouraged her to work on diet and exercise goal to be below 130/80, could increase Norvasc to 10 mg daily if BP not improved  2. Prediabetes  R73.03 Comprehensive metabolic panel    Hemoglobin A1c    Comprehensive metabolic panel    CANCELED: Hemoglobin A1c    CANCELED: Hemoglobin A1c   Recheck A1c  3. Class 2 severe obesity with serious comorbidity and body mass index (BMI) of 36.0 to 36.9 in adult, unspecified obesity type (HCC)  E66.01    Z68.36    increased weight with little to do, no exercise, eating more, she wants to work on diet and exercise  4. Tobacco abuse  Z72.0    smoking cessation discussed, increased smoking amt due to being stuck at home more  5. Medication monitoring encounter  Z51.81 Comprehensive metabolic panel    Hemoglobin A1c  Comprehensive metabolic panel    CANCELED: COMPLETE METABOLIC PANEL WITH GFR    CANCELED: Hemoglobin A1c    CANCELED: Hemoglobin A1c  6. Encounter for screening mammogram for malignant neoplasm of breast  Z12.31 MM 3D SCREEN BREAST BILATERAL   ordered, pt wants to wait a little while due to dense breast tissue and covid vax, she doesn't want false positive imaging - encouraged her to call in 1-2 m  7. Need for 23-polyvalent pneumococcal polysaccharide vaccine  Z23    just did 2nd COVID vax, will return in ~2 weeks for needed vaccines  8. Need for vaccine for DT (diphtheria-tetanus)  Z23    just did 2nd [place], will return in ~2 weeks for  needed vaccines     Return for 6 month f/up HTN  (wants nurse visit for vaccines in the next couple weeks).   Delsa Grana, PA-C 02/17/20 12:26 PM

## 2020-02-17 NOTE — Patient Instructions (Signed)
Follow up sooner if BP is >130/80 so we can adjust your medicines.   Would increase amlodipine to 10 mg and then monitor.   Hypertension, Adult Hypertension is another name for high blood pressure. High blood pressure forces your heart to work harder to pump blood. This can cause problems over time. There are two numbers in a blood pressure reading. There is a top number (systolic) over a bottom number (diastolic). It is best to have a blood pressure that is below 120/80. Healthy choices can help lower your blood pressure, or you may need medicine to help lower it. What are the causes? The cause of this condition is not known. Some conditions may be related to high blood pressure. What increases the risk?  Smoking.  Having type 2 diabetes mellitus, high cholesterol, or both.  Not getting enough exercise or physical activity.  Being overweight.  Having too much fat, sugar, calories, or salt (sodium) in your diet.  Drinking too much alcohol.  Having long-term (chronic) kidney disease.  Having a family history of high blood pressure.  Age. Risk increases with age.  Race. You may be at higher risk if you are African American.  Gender. Men are at higher risk than women before age 40. After age 96, women are at higher risk than men.  Having obstructive sleep apnea.  Stress. What are the signs or symptoms?  High blood pressure may not cause symptoms. Very high blood pressure (hypertensive crisis) may cause: ? Headache. ? Feelings of worry or nervousness (anxiety). ? Shortness of breath. ? Nosebleed. ? A feeling of being sick to your stomach (nausea). ? Throwing up (vomiting). ? Changes in how you see. ? Very bad chest pain. ? Seizures. How is this treated?  This condition is treated by making healthy lifestyle changes, such as: ? Eating healthy foods. ? Exercising more. ? Drinking less alcohol.  Your health care provider may prescribe medicine if lifestyle changes are  not enough to get your blood pressure under control, and if: ? Your top number is above 130. ? Your bottom number is above 80.  Your personal target blood pressure may vary. Follow these instructions at home: Eating and drinking   If told, follow the DASH eating plan. To follow this plan: ? Fill one half of your plate at each meal with fruits and vegetables. ? Fill one fourth of your plate at each meal with whole grains. Whole grains include whole-wheat pasta, brown rice, and whole-grain bread. ? Eat or drink low-fat dairy products, such as skim milk or low-fat yogurt. ? Fill one fourth of your plate at each meal with low-fat (lean) proteins. Low-fat proteins include fish, chicken without skin, eggs, beans, and tofu. ? Avoid fatty meat, cured and processed meat, or chicken with skin. ? Avoid pre-made or processed food.  Eat less than 1,500 mg of salt each day.  Do not drink alcohol if: ? Your doctor tells you not to drink. ? You are pregnant, may be pregnant, or are planning to become pregnant.  If you drink alcohol: ? Limit how much you use to:  0-1 drink a day for women.  0-2 drinks a day for men. ? Be aware of how much alcohol is in your drink. In the [place] one drink equals one 12 oz bottle of beer (355 mL), one 5 oz glass of wine (148 mL), or one 1 oz glass of hard liquor (44 mL). Lifestyle   Work with your doctor to stay at  a healthy weight or to lose weight. Ask your doctor what the best weight is for you.  Get at least 30 minutes of exercise most days of the week. This may include walking, swimming, or biking.  Get at least 30 minutes of exercise that strengthens your muscles (resistance exercise) at least 3 days a week. This may include lifting weights or doing Pilates.  Do not use any products that contain nicotine or tobacco, such as cigarettes, e-cigarettes, and chewing tobacco. If you need help quitting, ask your doctor.  Check your blood pressure at home as told  by your doctor.  Keep all follow-up visits as told by your doctor. This is important. Medicines  Take over-the-counter and prescription medicines only as told by your doctor. Follow directions carefully.  Do not skip doses of blood pressure medicine. The medicine does not work as well if you skip doses. Skipping doses also puts you at risk for problems.  Ask your doctor about side effects or reactions to medicines that you should watch for. Contact a doctor if you:  Think you are having a reaction to the medicine you are taking.  Have headaches that keep coming back (recurring).  Feel dizzy.  Have swelling in your ankles.  Have trouble with your vision. Get help right away if you:  Get a very bad headache.  Start to feel mixed up (confused).  Feel weak or numb.  Feel faint.  Have very bad pain in your: ? Chest. ? Belly (abdomen).  Throw up more than once.  Have trouble breathing. Summary  Hypertension is another name for high blood pressure.  High blood pressure forces your heart to work harder to pump blood.  For most people, a normal blood pressure is less than 120/80.  Making healthy choices can help lower blood pressure. If your blood pressure does not get lower with healthy choices, you may need to take medicine. This information is not intended to replace advice given to you by your health care provider. Make sure you discuss any questions you have with your health care provider. Document Revised: 08/01/2018 Document Reviewed: 08/01/2018 Elsevier Patient Education  2020 [employer].

## 2020-02-18 LAB — COMPREHENSIVE METABOLIC PANEL
ALT: 16 IU/L (ref 0–32)
AST: 13 IU/L (ref 0–40)
Albumin/Globulin Ratio: 1.8 (ref 1.2–2.2)
Albumin: 4.2 g/dL (ref 3.8–4.8)
Alkaline Phosphatase: 84 IU/L (ref 39–117)
BUN/Creatinine Ratio: 15 (ref 12–28)
BUN: 13 mg/dL (ref 8–27)
Bilirubin Total: 0.2 mg/dL (ref 0.0–1.2)
CO2: 26 mmol/L (ref 20–29)
Calcium: 10.6 mg/dL — ABNORMAL HIGH (ref 8.7–10.3)
Chloride: 103 mmol/L (ref 96–106)
Creatinine, Ser: 0.84 mg/dL (ref 0.57–1.00)
GFR calc Af Amer: 83 mL/min/{1.73_m2} (ref >59)
GFR calc non Af Amer: 72 mL/min/{1.73_m2} (ref >59)
Globulin, Total: 2.3 g/dL (ref 1.5–4.5)
Glucose: 87 mg/dL (ref 65–99)
Potassium: 4 mmol/L (ref 3.5–5.2)
Sodium: 143 mmol/L (ref 134–144)
Total Protein: 6.5 g/dL (ref 6.0–8.5)

## 2020-02-18 LAB — HEMOGLOBIN A1C
Est. average glucose Bld gHb Est-mCnc: 134 mg/dL
Hgb A1c MFr Bld: 6.3 % — ABNORMAL HIGH (ref 4.8–5.6)

## 2020-02-25 ENCOUNTER — Ambulatory Visit (INDEPENDENT_AMBULATORY_CARE_PROVIDER_SITE_OTHER): Payer: Managed Care, Other (non HMO)

## 2020-02-25 ENCOUNTER — Other Ambulatory Visit: Payer: Self-pay

## 2020-02-25 DIAGNOSIS — Z23 Encounter for immunization: Secondary | ICD-10-CM | POA: Diagnosis not present

## 2020-07-15 ENCOUNTER — Other Ambulatory Visit: Payer: Self-pay | Admitting: Family Medicine

## 2020-07-15 DIAGNOSIS — I10 Essential (primary) hypertension: Secondary | ICD-10-CM

## 2020-08-18 NOTE — Progress Notes (Signed)
Name: Laura Kent   MRN: [id number]    DOB: [birth year]   Date:08/19/2020       Progress Note  Chief Complaint  Patient presents with   Hypertension    follow up     Subjective:   Laura Kent is a 69 y.o. female, presents to clinic for routine f/up   Hypertension:  Currently managed on Amlodopine 5 mg qd and Diovan 160-25 mg qd Pt reports good med compliance and denies any SE.   Blood pressure today is well controlled. BP Readings from Last 3 Encounters:  08/19/20 126/76  02/17/20 138/88  08/19/19 130/82   Pt denies CP, SOB, exertional sx, LE edema, palpitation, Ha's, visual disturbances, lightheadedness, hypotension, syncope. Still working on trying to eat healthy Some decreased exercise since last OV due to injury  Still smokes  Hx of hyperthyroid - last last done a year ago Current Symptoms: denies fatigue, weight changes, heat/cold intolerance, bowel/skin changes or CVS symptoms, palpitations, sweats, unintentional weight loss Most recent results are below; we will be repeating labs today. Lab Results  Component Value Date   TSH 3.370 08/19/2019   Calcium high last OV in March, she is taking calcium and vit D supplement 1200 mg calcium + vit D - she takes doses irregularly wasn't sure if it was 600, 1200 or 1500 - reviewed today with pt Last Dexa 2019 was normal - reviewed reports and f/up dexa  Due for mammogram - ordered previously but not done - reviewed with pt  Health Maintenance  Topic Date Due   MAMMOGRAM  01/10/2019   INFLUENZA VACCINE  03/04/2021 (Originally 07/05/2020)   DEXA SCAN  01/10/2023   COLONOSCOPY  01/17/2027   TETANUS/TDAP  02/24/2030   COVID-19 Vaccine  Completed   Hepatitis C Screening  Completed   PNA vac Low Risk Adult  Completed      Current Outpatient Medications:    amLODipine (NORVASC) 5 MG tablet, TAKE 1 TABLET BY MOUTH  DAILY, Disp: 90 tablet, Rfl: 3   aspirin EC 81 MG tablet, Take 81 mg by mouth daily.,  Disp: , Rfl:    Calcium Carbonate-Vitamin D 600-125 MG-UNIT TABS, Take by mouth daily., Disp: , Rfl:    cholecalciferol (VITAMIN D) 1000 units tablet, Take 1,000 Units by mouth daily as needed., Disp: , Rfl:    Turmeric 500 MG CAPS, Take by mouth., Disp: , Rfl:    valsartan-hydrochlorothiazide (DIOVAN-HCT) 160-25 MG tablet, TAKE 1 TABLET BY MOUTH  DAILY, Disp: 90 tablet, Rfl: 3  Patient Active Problem List   Diagnosis Date Noted   Hypercalcemia 08/19/2020   Prediabetes 08/19/2019   Tobacco abuse 11/24/2017   Hyperthyroidism 04/30/2017   Plantar fasciitis 04/21/2017   Colon cancer screening 11/21/2016   Neoplasm of uncertain behavior of skin 11/21/2016   Medication monitoring encounter 05/25/2016   Preventative health care 11/16/2015   Right arm pain 11/16/2015   Screening for cervical cancer 11/16/2015   Impaired fasting glucose 05/19/2015   Benign hypertension 05/19/2015   Obesity 05/19/2015    Past Surgical History:  Procedure Laterality Date   BREAST CYST ASPIRATION Right 2011   BUNIONECTOMY     CHOLECYSTECTOMY  Oct 2014   COLONOSCOPY WITH PROPOFOL N/A 01/17/2017   Procedure: COLONOSCOPY WITH PROPOFOL;  Surgeon: Robert Bellow, MD;  Location: [facility] ENDOSCOPY;  Service: Endoscopy;  Laterality: N/A;   POSTERIOR TIBIAL TENDON REPAIR Left 10/26/2018    Family History  Problem Relation Age of Onset   Diabetes  Mother    Kidney disease Mother    Heart disease Mother    Hypertension Mother    Diabetes Father    Heart disease Father    Hypertension Father    Cancer Brother        leukemia   Heart disease Brother        tachycardia   Stroke Brother        fatal   Diabetes Sister    Colon cancer Maternal Aunt    Diabetes Maternal Grandmother    Breast cancer Neg Hx     Social History   Tobacco Use   Smoking status: Current Every Day Smoker    Packs/day: 1.00    Years: 34.00    Pack years: 34.00    Types: Cigarettes    Last  attempt to quit: 05/18/2012    Years since quitting: 8.2   Smokeless tobacco: Never Used  Vaping Use   Vaping Use: Never used  Substance Use Topics   Alcohol use: No   Drug use: No     No Known Allergies  Health Maintenance  Topic Date Due   MAMMOGRAM  01/10/2019   INFLUENZA VACCINE  03/04/2021 (Originally 07/05/2020)   COLONOSCOPY  01/17/2027   TETANUS/TDAP  02/24/2030   DEXA SCAN  Completed   COVID-19 Vaccine  Completed   Hepatitis C Screening  Completed   PNA vac Low Risk Adult  Completed    Chart Review Today: I personally reviewed active problem list, medication list, allergies, family history, social history, health maintenance, notes from last encounter, lab results, imaging with the patient/caregiver today.   Review of Systems  10 Systems reviewed and are negative for acute change except as noted in the HPI.  Objective:   Vitals:   08/19/20 1014  BP: 126/76  Pulse: 86  Resp: 16  Temp: 98.4 F (36.9 C)  TempSrc: Oral  SpO2: 97%  Weight: 226 lb 1.6 oz (102.6 kg)  Height: 5\' 5"  (1.651 m)    Body mass index is 37.63 kg/m.  Physical Exam Vitals and nursing note reviewed.  Constitutional:      General: She is not in acute distress.    Appearance: Normal appearance. She is well-developed. She is not ill-appearing, toxic-appearing or diaphoretic.     Interventions: Face mask in place.  HENT:     Head: Normocephalic and atraumatic.     Right Ear: External ear normal.     Left Ear: External ear normal.  Eyes:     General: Lids are normal. No scleral icterus.       Right eye: No discharge.        Left eye: No discharge.     Conjunctiva/sclera: Conjunctivae normal.  Neck:     Trachea: Phonation normal. No tracheal deviation.  Cardiovascular:     Rate and Rhythm: Normal rate and regular rhythm.     Pulses: Normal pulses.          Radial pulses are 2+ on the right side and 2+ on the left side.       Posterior tibial pulses are 2+ on the right  side and 2+ on the left side.     Heart sounds: Normal heart sounds. No murmur heard.  No friction rub. No gallop.      Comments: 1+ pretibial edema b/l - right slightly worse than left Pulmonary:     Effort: Pulmonary effort is normal. No respiratory distress.     Breath sounds: Normal breath  sounds. No stridor. No wheezing, rhonchi or rales.  Chest:     Chest wall: No tenderness.  Abdominal:     General: Bowel sounds are normal. There is no distension.     Palpations: Abdomen is soft.  Musculoskeletal:     Right lower leg: Edema present.     Left lower leg: Edema present.  Skin:    General: Skin is warm and dry.     Capillary Refill: Capillary refill takes less than 2 seconds.     Coloration: Skin is not jaundiced or pale.     Findings: No rash.  Neurological:     Mental Status: She is alert.     Motor: No abnormal muscle tone.     Gait: Gait normal.  Psychiatric:        Mood and Affect: Mood normal.        Speech: Speech normal.        Behavior: Behavior normal.         Assessment & Plan:   1. Benign hypertension Stable, well controlled today, continue norvasc and diovan, monitor BP, advised pt to continue DASH and decrease/quit smoking, handout given - COMPLETE METABOLIC PANEL WITH GFR  2. Hypercalcemia Calcium high with last labs 10.6 - mildly elevated Pt on supplement Will recheck today - CMP  Discussed with her possible additional work up if Calcium is elevated or worsening  3. Class 2 severe obesity with serious comorbidity and body mass index (BMI) of 36.0 to 36.9 in adult, unspecified obesity type [facility]) Diet/exercise/lifetyle efforts encouraged  4. Prediabetes Has been stable prediabetes for a few years - she will continue to work on diet/lifestyle efforts - we will recheck A1C next OV  5. Hyperthyroidism Hx of, not currently on meds or seeing endocrinology Last labs done about a year ago No current sx concerning for hyper or hypothyroid, recheck labs  to continue to monitor  - TSH + free T4  6. Tobacco abuse Smoking cessation instruction/counseling given:  counseled patient on the dangers of tobacco use, advised patient to stop smoking, and reviewed strategies to maximize success Handout given  7. Medication monitoring encounter - COMPLETE METABOLIC PANEL WITH GFR   Return in about 6 months (around 02/16/2021) for Routine follow-up.   Delsa Grana, PA-C 08/19/20 10:40 AM

## 2020-08-19 ENCOUNTER — Encounter: Payer: Self-pay | Admitting: Family Medicine

## 2020-08-19 ENCOUNTER — Ambulatory Visit (INDEPENDENT_AMBULATORY_CARE_PROVIDER_SITE_OTHER): Payer: Medicare Other | Admitting: Family Medicine

## 2020-08-19 ENCOUNTER — Other Ambulatory Visit: Payer: Self-pay

## 2020-08-19 VITALS — BP 126/76 | HR 86 | Temp 98.4°F | Resp 16 | Ht 65.0 in | Wt 226.1 lb

## 2020-08-19 DIAGNOSIS — E0590 Thyrotoxicosis, unspecified without thyrotoxic crisis or storm: Secondary | ICD-10-CM

## 2020-08-19 DIAGNOSIS — Z5181 Encounter for therapeutic drug level monitoring: Secondary | ICD-10-CM

## 2020-08-19 DIAGNOSIS — R7303 Prediabetes: Secondary | ICD-10-CM

## 2020-08-19 DIAGNOSIS — I10 Essential (primary) hypertension: Secondary | ICD-10-CM | POA: Diagnosis not present

## 2020-08-19 DIAGNOSIS — Z6836 Body mass index (BMI) 36.0-36.9, adult: Secondary | ICD-10-CM

## 2020-08-19 DIAGNOSIS — M76829 Posterior tibial tendinitis, unspecified leg: Secondary | ICD-10-CM | POA: Insufficient documentation

## 2020-08-19 DIAGNOSIS — Z720 Tobacco use: Secondary | ICD-10-CM

## 2020-08-19 HISTORY — DX: Hypercalcemia: E83.52

## 2020-08-19 NOTE — Patient Instructions (Addendum)
[facility] at [facility] [place],  [place]  [address] Get Driving Directions Main: [phone]   Call and try to schedule your follow up mammogram - the order will be good until next March    Managing Your Hypertension Hypertension is commonly called high blood pressure. This is when the force of your blood pressing against the walls of your arteries is too strong. Arteries are blood vessels that carry blood from your heart throughout your body. Hypertension forces the heart to work harder to pump blood, and may cause the arteries to become narrow or stiff. Having untreated or uncontrolled hypertension can cause heart attack, stroke, kidney disease, and other problems. What are blood pressure readings? A blood pressure reading consists of a higher number over a lower number. Ideally, your blood pressure should be below 120/80. The first ("top") number is called the systolic pressure. It is a measure of the pressure in your arteries as your heart beats. The second ("bottom") number is called the diastolic pressure. It is a measure of the pressure in your arteries as the heart relaxes. What does my blood pressure reading mean? Blood pressure is classified into four stages. Based on your blood pressure reading, your health care provider may use the following stages to determine what type of treatment you need, if any. Systolic pressure and diastolic pressure are measured in a unit called mm Hg. Normal  Systolic pressure: below 093.  Diastolic pressure: below 80. Elevated  Systolic pressure: 818-299.  Diastolic pressure: below 80. Hypertension stage 1  Systolic pressure: 371-696.  Diastolic pressure: 78-93. Hypertension stage 2  Systolic pressure: 810 or above.  Diastolic pressure: 90 or above. What health risks are associated with hypertension? Managing your hypertension is an important responsibility. Uncontrolled hypertension can lead  to:  A heart attack.  A stroke.  A weakened blood vessel (aneurysm).  Heart failure.  Kidney damage.  Eye damage.  Metabolic syndrome.  Memory and concentration problems. What changes can I make to manage my hypertension? Hypertension can be managed by making lifestyle changes and possibly by taking medicines. Your health care provider will help you make a plan to bring your blood pressure within a normal range. Eating and drinking   Eat a diet that is high in fiber and potassium, and low in salt (sodium), added sugar, and fat. An example eating plan is called the DASH (Dietary Approaches to Stop Hypertension) diet. To eat this way: ? Eat plenty of fresh fruits and vegetables. Try to fill half of your plate at each meal with fruits and vegetables. ? Eat whole grains, such as whole wheat pasta, brown rice, or whole grain bread. Fill about one quarter of your plate with whole grains. ? Eat low-fat diary products. ? Avoid fatty cuts of meat, processed or cured meats, and poultry with skin. Fill about one quarter of your plate with lean proteins such as fish, chicken without skin, beans, eggs, and tofu. ? Avoid premade and processed foods. These tend to be higher in sodium, added sugar, and fat.  Reduce your daily sodium intake. Most people with hypertension should eat less than 1,500 mg of sodium a day.  Limit alcohol intake to no more than 1 drink a day for nonpregnant women and 2 drinks a day for men. One drink equals 12 oz of beer, 5 oz of wine, or 1 oz of hard liquor. Lifestyle  Work with your health care provider to maintain a healthy body weight,  or to lose weight. Ask what an ideal weight is for you.  Get at least 30 minutes of exercise that causes your heart to beat faster (aerobic exercise) most days of the week. Activities may include walking, swimming, or biking.  Include exercise to strengthen your muscles (resistance exercise), such as weight lifting, as part of your  weekly exercise routine. Try to do these types of exercises for 30 minutes at least 3 days a week.  Do not use any products that contain nicotine or tobacco, such as cigarettes and e-cigarettes. If you need help quitting, ask your health care provider.  Control any long-term (chronic) conditions you have, such as high cholesterol or diabetes. Monitoring  Monitor your blood pressure at home as told by your health care provider. Your personal target blood pressure may vary depending on your medical conditions, your age, and other factors.  Have your blood pressure checked regularly, as often as told by your health care provider. Working with your health care provider  Review all the medicines you take with your health care provider because there may be side effects or interactions.  Talk with your health care provider about your diet, exercise habits, and other lifestyle factors that may be contributing to hypertension.  Visit your health care provider regularly. Your health care provider can help you create and adjust your plan for managing hypertension. Will I need medicine to control my blood pressure? Your health care provider may prescribe medicine if lifestyle changes are not enough to get your blood pressure under control, and if:  Your systolic blood pressure is 130 or higher.  Your diastolic blood pressure is 80 or higher. Take medicines only as told by your health care provider. Follow the directions carefully. Blood pressure medicines must be taken as prescribed. The medicine does not work as well when you skip doses. Skipping doses also puts you at risk for problems. Contact a health care provider if:  You think you are having a reaction to medicines you have taken.  You have repeated (recurrent) headaches.  You feel dizzy.  You have swelling in your ankles.  You have trouble with your vision. Get help right away if:  You develop a severe headache or confusion.  You  have unusual weakness or numbness, or you feel faint.  You have severe pain in your chest or abdomen.  You vomit repeatedly.  You have trouble breathing. Summary  Hypertension is when the force of blood pumping through your arteries is too strong. If this condition is not controlled, it may put you at risk for serious complications.  Your personal target blood pressure may vary depending on your medical conditions, your age, and other factors. For most people, a normal blood pressure is less than 120/80.  Hypertension is managed by lifestyle changes, medicines, or both. Lifestyle changes include weight loss, eating a healthy, low-sodium diet, exercising more, and limiting alcohol. This information is not intended to replace advice given to you by your health care provider. Make sure you discuss any questions you have with your health care provider. Document Revised: 03/15/2019 Document Reviewed: 10/19/2016 Elsevier Patient Education  [place].    Hypercalcemia Hypercalcemia is when the level of calcium in a person's blood is above normal. The body needs calcium to make bones and keep them strong. Calcium also helps the muscles, nerves, brain, and heart work the way they should. Most of the calcium in the body is in the bones. There is also some calcium  in the blood. Hypercalcemia can happen when calcium comes out of the bones, or when the kidneys are not able to remove calcium from the blood. Hypercalcemia can be mild or severe. What are the causes? There are many possible causes of hypercalcemia. Common causes of this condition include:  Hyperparathyroidism. This is a condition in which the body produces too much parathyroid hormone. There are four parathyroid glands in your neck. These glands produce a chemical messenger (hormone) that helps the body absorb calcium from foods and helps your bones release calcium.  Certain kinds of cancer. Less common causes of hypercalcemia  include:  Getting too much calcium or vitamin D from your diet.  Kidney failure.  Hyperthyroidism.  Severe dehydration.  Being on bed rest or being inactive for a long time.  Certain medicines.  Infections. What increases the risk? You are more likely to develop this condition if you:  Are female.  Are 64 years of age or older.  Have a family history of hypercalcemia. What are the signs or symptoms? Mild hypercalcemia that starts slowly may not cause symptoms. Severe, sudden hypercalcemia is more likely to cause symptoms, such as:  Being more thirsty than usual.  Needing to urinate more often than usual.  Abdominal pain.  Nausea and vomiting.  Constipation.  Muscle pain, twitching, or weakness.  Feeling very tired. How is this diagnosed?  Hypercalcemia is usually diagnosed with a blood test. You may also have tests to help determine what is causing this condition, such as imaging tests and more blood tests. How is this treated? Treatment for hypercalcemia depends on the cause. Treatment may include:  Receiving fluids through an IV.  Medicines that: ? Keep calcium levels steady after receiving fluids (loop diuretics). ? Keep calcium in your bones (bisphosphonates). ? Lower the calcium level in your blood.  Surgery to remove overactive parathyroid glands.  A procedure that filters your blood to correct calcium levels (hemodialysis). Follow these instructions at home:   Take over-the-counter and prescription medicines only as told by your health care provider.  Follow instructions from your health care provider about eating or drinking restrictions.  Drink enough fluid to keep your urine pale yellow.  Stay active. Weight-bearing exercise helps to keep calcium in your bones. Follow instructions from your health care provider about what type and level of exercise is safe for you.  Keep all follow-up visits as told by your health care provider. This is  important. Contact a health care provider if you have:  A fever.  A heartbeat that is irregular or very fast.  Changes in mood, memory, or personality. Get help right away if you:  Have severe abdominal pain.  Have chest pain.  Have trouble breathing.  Become very confused and sleepy.  Lose consciousness. Summary  Hypercalcemia is when the level of calcium in a person's blood is above normal. The body needs calcium to make bones and keep them strong. Calcium also helps the muscles, nerves, brain, and heart work the way they should.  There are many possible causes of hypercalcemia, and treatment depends on the cause.  Take over-the-counter and prescription medicines only as told by your health care provider.  Follow instructions from your health care provider about eating or drinking restrictions. This information is not intended to replace advice given to you by your health care provider. Make sure you discuss any questions you have with your health care provider. Document Revised: 12/18/2018 Document Reviewed: 08/27/2018 Elsevier Patient Education  2020 Elsevier  Inc.    Steps to Quit Smoking Smoking tobacco is the leading cause of preventable death. It can affect almost every organ in the body. Smoking puts you and people around you at risk for many serious, long-lasting (chronic) diseases. Quitting smoking can be hard, but it is one of the best things that you can do for your health. It is never too late to quit. How do I get ready to quit? When you decide to quit smoking, make a plan to help you succeed. Before you quit:  Pick a date to quit. Set a date within the next 2 weeks to give you time to prepare.  Write down the reasons why you are quitting. Keep this list in places where you will see it often.  Tell your family, friends, and co-workers that you are quitting. Their support is important.  Talk with your doctor about the choices that may help you quit.  Find  out if your health insurance will pay for these treatments.  Know the people, places, things, and activities that make you want to smoke (triggers). Avoid them. What first steps can I take to quit smoking?  Throw away all cigarettes at home, at work, and in your car.  Throw away the things that you use when you smoke, such as ashtrays and lighters.  Clean your car. Make sure to empty the ashtray.  Clean your home, including curtains and carpets. What can I do to help me quit smoking? Talk with your doctor about taking medicines and seeing a counselor at the same time. You are more likely to succeed when you do both.  If you are pregnant or breastfeeding, talk with your doctor about counseling or other ways to quit smoking. Do not take medicine to help you quit smoking unless your doctor tells you to do so. To quit smoking: Quit right away  Quit smoking totally, instead of slowly cutting back on how much you smoke over a period of time.  Go to counseling. You are more likely to quit if you go to counseling sessions regularly. Take medicine You may take medicines to help you quit. Some medicines need a prescription, and some you can buy over-the-counter. Some medicines may contain a drug called nicotine to replace the nicotine in cigarettes. Medicines may:  Help you to stop having the desire to smoke (cravings).  Help to stop the problems that come when you stop smoking (withdrawal symptoms). Your doctor may ask you to use:  Nicotine patches, gum, or lozenges.  Nicotine inhalers or sprays.  Non-nicotine medicine that is taken by mouth. Find resources Find resources and other ways to help you quit smoking and remain smoke-free after you quit. These resources are most helpful when you use them often. They include:  Online chats with a Social worker.  Phone quitlines.  Printed Furniture conservator/restorer.  Support groups or group counseling.  Text messaging programs.  Mobile phone  apps. Use apps on your mobile phone or tablet that can help you stick to your quit plan. There are many free apps for mobile phones and tablets as well as websites. Examples include Quit Guide from the [employer] and smokefree.gov  What things can I do to make it easier to quit?   Talk to your family and friends. Ask them to support and encourage you.  Call a phone quitline (1-800-QUIT-NOW), reach out to support groups, or work with a Social worker.  Ask people who smoke to not smoke around you.  Avoid places that  make you want to smoke, such as: ? Bars. ? Parties. ? Smoke-break areas at work.  Spend time with people who do not smoke.  Lower the stress in your life. Stress can make you want to smoke. Try these things to help your stress: ? Getting regular exercise. ? Doing deep-breathing exercises. ? Doing yoga. ? Meditating. ? Doing a body scan. To do this, close your eyes, focus on one area of your body at a time from head to toe. Notice which parts of your body are tense. Try to relax the muscles in those areas. How will I feel when I quit smoking? Day 1 to 3 weeks Within the first 24 hours, you may start to have some problems that come from quitting tobacco. These problems are very bad 2-3 days after you quit, but they do not often last for more than 2-3 weeks. You may get these symptoms:  Mood swings.  Feeling restless, nervous, angry, or annoyed.  Trouble concentrating.  Dizziness.  Strong desire for high-sugar foods and nicotine.  Weight gain.  Trouble pooping (constipation).  Feeling like you may vomit (nausea).  Coughing or a sore throat.  Changes in how the medicines that you take for other issues work in your body.  Depression.  Trouble sleeping (insomnia). Week 3 and afterward After the first 2-3 weeks of quitting, you may start to notice more positive results, such as:  Better sense of smell and taste.  Less coughing and sore throat.  Slower heart  rate.  Lower blood pressure.  Clearer skin.  Better breathing.  Fewer sick days. Quitting smoking can be hard. Do not give up if you fail the first time. Some people need to try a few times before they succeed. Do your best to stick to your quit plan, and talk with your doctor if you have any questions or concerns. Summary  Smoking tobacco is the leading cause of preventable death. Quitting smoking can be hard, but it is one of the best things that you can do for your health.  When you decide to quit smoking, make a plan to help you succeed.  Quit smoking right away, not slowly over a period of time.  When you start quitting, seek help from your doctor, family, or friends. This information is not intended to replace advice given to you by your health care provider. Make sure you discuss any questions you have with your health care provider. Document Revised: 08/16/2019 Document Reviewed: 02/09/2019 Elsevier Patient Education  [place].

## 2020-08-20 LAB — COMPLETE METABOLIC PANEL WITH GFR
AG Ratio: 1.5 (calc) (ref 1.0–2.5)
ALT: 12 U/L (ref 6–29)
AST: 14 U/L (ref 10–35)
Albumin: 4 g/dL (ref 3.6–5.1)
Alkaline phosphatase (APISO): 73 U/L (ref 37–153)
BUN: 16 mg/dL (ref 7–25)
CO2: 29 mmol/L (ref 20–32)
Calcium: 10.1 mg/dL (ref 8.6–10.4)
Chloride: 104 mmol/L (ref 98–110)
Creat: 0.73 mg/dL (ref 0.50–0.99)
GFR, Est African American: 99 mL/min/{1.73_m2} (ref >=60)
GFR, Est Non African American: 85 mL/min/{1.73_m2} (ref >=60)
Globulin: 2.6 g/dL (calc) (ref 1.9–3.7)
Glucose, Bld: 101 mg/dL — ABNORMAL HIGH (ref 65–99)
Potassium: 3.6 mmol/L (ref 3.5–5.3)
Sodium: 141 mmol/L (ref 135–146)
Total Bilirubin: 0.5 mg/dL (ref 0.2–1.2)
Total Protein: 6.6 g/dL (ref 6.1–8.1)

## 2020-08-20 LAB — TSH+FREE T4: TSH W/REFLEX TO FT4: 4.21 mIU/L (ref 0.40–4.50)

## 2020-11-04 ENCOUNTER — Ambulatory Visit (INDEPENDENT_AMBULATORY_CARE_PROVIDER_SITE_OTHER): Payer: Medicare Other

## 2020-11-04 ENCOUNTER — Other Ambulatory Visit: Payer: Self-pay

## 2020-11-04 DIAGNOSIS — Z23 Encounter for immunization: Secondary | ICD-10-CM

## 2020-12-15 ENCOUNTER — Ambulatory Visit
Admission: RE | Admit: 2020-12-15 | Discharge: 2020-12-15 | Disposition: A | Discharge status: Home / Self Care | Payer: Medicare Other | Source: Ambulatory Visit | Attending: Family Medicine | Admitting: Family Medicine

## 2020-12-15 ENCOUNTER — Other Ambulatory Visit: Payer: Self-pay

## 2020-12-15 DIAGNOSIS — Z1231 Encounter for screening mammogram for malignant neoplasm of breast: Secondary | ICD-10-CM | POA: Diagnosis present

## 2021-02-16 ENCOUNTER — Ambulatory Visit: Payer: Medicare Other | Admitting: Family Medicine

## 2021-03-11 ENCOUNTER — Other Ambulatory Visit: Payer: Self-pay

## 2021-03-11 ENCOUNTER — Ambulatory Visit (INDEPENDENT_AMBULATORY_CARE_PROVIDER_SITE_OTHER): Payer: Medicare Other

## 2021-03-11 VITALS — BP 132/76 | HR 87 | Temp 98.1°F | Resp 16 | Ht 65.0 in | Wt 225.1 lb

## 2021-03-11 DIAGNOSIS — Z0000 Encounter for general adult medical examination without abnormal findings: Secondary | ICD-10-CM

## 2021-03-11 NOTE — Progress Notes (Signed)
Subjective:   Laura Kent is a 70 y.o. female who presents for Initial Medicare Annual preventive examination.  Review of Systems     Cardiac Risk Factors include: advanced age (>33men, >44 women);hypertension;obesity (BMI >30kg/m2);smoking/ tobacco exposure     Objective:    Today's Vitals   03/11/21 1451  BP: 132/76  Pulse: 87  Resp: 16  Temp: 98.1 F (36.7 C)  TempSrc: Oral  SpO2: 99%  Weight: 225 lb 1.6 oz (102.1 kg)  Height: 5\' 5"  (1.651 m)   Body mass index is 37.46 kg/m.  Advanced Directives 03/11/2021 04/24/2017 01/17/2017 11/21/2016 05/25/2016  Does Patient Have a Medical Advance Directive? No No No No No  Would patient like information on creating a medical advance directive? Yes (MAU/Ambulatory/Procedural Areas - Information given) - - - No - patient declined information    Current Medications (verified) Outpatient Encounter Medications as of 03/11/2021  Medication Sig  . amLODipine (NORVASC) 5 MG tablet TAKE 1 TABLET BY MOUTH  DAILY  . aspirin EC 81 MG tablet Take 81 mg by mouth daily.  . Calcium Carbonate-Vitamin D 600-125 MG-UNIT TABS Take by mouth daily.  . cholecalciferol (VITAMIN D) 1000 units tablet Take 1,000 Units by mouth daily as needed.  . Turmeric 500 MG CAPS Take by mouth.  . valsartan-hydrochlorothiazide (DIOVAN-HCT) 160-25 MG tablet TAKE 1 TABLET BY MOUTH  DAILY   No facility-administered encounter medications on file as of 03/11/2021.    Allergies (verified) Patient has no known allergies.   History: Past Medical History:  Diagnosis Date  . GERD (gastroesophageal reflux disease)   . Hypertension   . Hyperthyroidism    Treated with radioactive iodine 2014  . IFG (impaired fasting glucose)   . Menopausal syndrome   . 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 Obesity   . Plantar fascial fibromatosis of left foot   . Plantar fasciitis 04/21/2017  . Reflux    Past Surgical History:  Procedure Laterality Date  . BREAST  CYST ASPIRATION Right 2011  . BUNIONECTOMY    . CHOLECYSTECTOMY  Oct 2014  . COLONOSCOPY WITH PROPOFOL N/A 01/17/2017   Procedure: COLONOSCOPY WITH PROPOFOL;  Surgeon: Robert Bellow, MD;  Location: [facility] END[facility];  Service: Endoscopy;  Laterality: N/A;  . POSTERIOR TIBIAL TENDON REPAIR Left 10/26/2018   Family History  Problem Relation Age of Onset  . Diabetes Mother   . Kidney disease Mother   . Heart disease Mother   . Hypertension Mother   . Diabetes Father   . Heart disease Father   . Hypertension Father   . Cancer Brother        leukemia  . Heart disease Brother        tachycardia  . Stroke Brother        fatal  . Diabetes Sister   . Colon cancer Maternal Aunt   . Diabetes Maternal Grandmother   . Breast cancer Neg Hx    Social History   Socioeconomic History  . Marital status: Married    Spouse name: Not on file  . Number of children: Not on file  . Years of education: Not on file  . Highest education level: Not on file  Occupational History  . Occupation: retired  Tobacco Use  . Smoking status: Current Every Day Smoker    Packs/day: 1.00    Years: 34.00    Pack years: 34.00    Types: Cigarettes    Last attempt to quit: 05/18/2012  Years since quitting: 8.8  . Smokeless tobacco: Never Used  Vaping Use  . Vaping Use: Never used  Substance and Sexual Activity  . Alcohol use: No  . Drug use: No  . Sexual activity: Not Currently  Other Topics Concern  . Not on file  Social History Narrative  . Not on file   Social Determinants of Health   Financial Resource Strain: Low Risk   . Difficulty of Paying Living Expenses: Not hard at all  Food Insecurity: No Food Insecurity  . Worried About Charity fundraiser in the Last Year: Never true  . Ran Out of Food in the Last Year: Never true  Transportation Needs: No Transportation Needs  . Lack of Transportation (Medical): No  . Lack of Transportation (Non-Medical): No  Physical Activity: Inactive  . Days  of Exercise per Week: 0 days  . Minutes of Exercise per Session: 0 min  Stress: No Stress Concern Present  . Feeling of Stress : Not at all  Social Connections: Moderately Isolated  . Frequency of Communication with Friends and Family: More than three times a week  . Frequency of Social Gatherings with Friends and Family: Three times a week  . Attends Religious Services: Never  . Active Member of Clubs or Organizations: No  . Attends Archivist Meetings: Never  . Marital Status: Married    Tobacco Counseling Ready to quit: No Counseling given: Not Answered   Clinical Intake:  Pre-visit preparation completed: Yes  Pain : No/denies pain     BMI - recorded: 37.46 Nutritional Status: BMI > 30  Obese Nutritional Risks: None Diabetes: No  How often do you need to have someone help you when you read instructions, pamphlets, or other written materials from your doctor or pharmacy?: 1 - Never    Interpreter Needed?: No  Information entered by :: [name] LPN   Activities of Daily Living In your present state of health, do you have any difficulty performing the following activities: 03/11/2021 08/19/2020  Hearing? N N  Comment declines hearing aids -  Vision? N N  Difficulty concentrating or making decisions? N N  Walking or climbing stairs? N N  Dressing or bathing? N N  Doing errands, shopping? N N  Preparing Food and eating ? N -  Using the Toilet? N -  In the past six months, have you accidently leaked urine? N -  Do you have problems with loss of bowel control? N -  Managing your Medications? N -  Managing your Finances? N -  Housekeeping or managing your Housekeeping? N -  Some recent data might be hidden    Patient Care Team: Delsa Grana, PA-C as PCP - General (Family Medicine)  Indicate any recent Medical Services you may have received from other than [facility] providers in the past year (date may be approximate).     Assessment:   This is a  routine wellness examination for Laura Kent.  Hearing/Vision screen  Hearing Screening   125Hz  250Hz  500Hz  1000Hz  2000Hz  3000Hz  4000Hz  6000Hz  8000Hz   Right ear:           Left ear:           Comments: Pt denies hearing difficulty  Vision Screening Comments: [facility] screen[facility].  Dietary issues and exercise activities discussed: Current Exercise Habits: The patient does not participate in regular exercise at present, Exercise limited by: None identified  Goals    . Increase physical  activity     Recommend increasing physical activity to at least 3 days per week       Depression Screen PHQ 2/9 Scores 03/11/2021 08/19/2020 02/17/2020 08/19/2019 04/16/2019 10/16/2018 01/25/2018  PHQ - 2 Score 0 0 0 1 0 0 0  PHQ- 9 Score - - 0 1 0 0 -    Fall Risk Fall Risk  03/11/2021 08/19/2020 02/17/2020 08/19/2019 04/16/2019  Falls in the past year? 0 0 0 1 0  Number falls in past yr: 0 0 0 1 -  Injury with Fall? 0 0 0 0 -  Risk for fall due to : No Fall Risks - - - -  Follow up Falls prevention discussed Falls evaluation completed - - -    FALL RISK PREVENTION PERTAINING TO THE HOME:  Any stairs in or around the home? Yes  If so, are there any without handrails? Yes  - side steps outside Home free of loose throw rugs in walkways, pet beds, electrical cords, etc? Yes  Adequate lighting in your home to reduce risk of falls? Yes   ASSISTIVE DEVICES UTILIZED TO PREVENT FALLS:  Life alert? No  Use of a cane, walker or w/c? No  Grab bars in the bathroom? Yes  Shower chair or bench in shower? Yes  Elevated toilet seat or a handicapped toilet? Yes   TIMED UP AND GO:  Was the test performed? Yes .  Length of time to ambulate 10 feet: 5 sec.   Gait steady and fast without use of assistive device  Cognitive Function: Normal cognitive status assessed by direct observation by this Nurse Health Advisor. No abnormalities found.          Immunizations Immunization  History  Administered Date(s) Administered  . Fluad Quad(high Dose 65+) 11/04/2020  . Influenza, High Dose Seasonal PF 09/24/2018  . Influenza,inj,Quad PF,6+ Mos 10/04/2019  . Influenza-Unspecified 08/12/2014, 08/06/2015, 09/24/2018  . PFIZER(Purple Top)SARS-COV-2 Vaccination 01/15/2020, 02/05/2020, 09/29/2020  . Pneumococcal Conjugate-13 10/16/2018  . Pneumococcal Polysaccharide-23 08/24/2012, 02/25/2020  . Td 08/05/2009  . Tdap 02/25/2020  . Zoster 05/19/2015    TDAP status: Up to date  Flu Vaccine status: Up to date  Pneumococcal vaccine status: Up to date  Covid-19 vaccine status: Completed vaccines  Qualifies for Shingles Vaccine? Yes   Zostavax completed Yes   Shingrix Completed?: No.    Education has been provided regarding the importance of this vaccine. Patient has been advised to call insurance company to determine out of pocket expense if they have not yet received this vaccine. Advised may also receive vaccine at local pharmacy or Health Dept. Verbalized acceptance and understanding.  Screening Tests Health Maintenance  Topic Date Due  . INFLUENZA VACCINE  07/05/2021  . MAMMOGRAM  12/15/2021  . DEXA SCAN  01/10/2023  . COLONOSCOPY (Pts 45-65yrs Insurance coverage will need to be confirmed)  01/17/2027  . TETANUS/TDAP  02/24/2030  . COVID-19 Vaccine  Completed  . Hepatitis C Screening  Completed  . PNA vac Low Risk Adult  Completed  . HPV VACCINES  Aged Out    Health Maintenance  There are no preventive care reminders to display for this patient.  Colorectal cancer screening: Type of screening: Colonoscopy. Completed 01/17/17. Repeat every 10 years  Mammogram status: Completed 12/15/20. Repeat every year  Bone Density status: Completed 01/10/18. Results reflect: Bone density results: NORMAL. Repeat every 5 years.  Lung Cancer Screening: (Low Dose CT Chest recommended if Age 91-80 years, 30 pack-year currently smoking OR have  quit w/in 15years.) does qualify.  Pt declined.   Additional Screening:  Hepatitis C Screening: does qualify; Completed 11/16/15  Vision Screening: Recommended annual ophthalmology exams for early detection of glaucoma and other disorders of the eye. Is the patient up to date with their annual eye exam?  Yes  Who is the provider or what is the name of the office in which the patient attends annual eye exams? [place] Screening: Recommended annual dental exams for proper oral hygiene  Community Resource Referral / Chronic Care Management: CRR required this visit?  No   CCM required this visit?  No      Plan:     I have personally reviewed and noted the following in the patient's chart:   . Medical and social history . Use of alcohol, tobacco or illicit drugs  . Current medications and supplements . Functional ability and status . Nutritional status . Physical activity . Advanced directives . List of other physicians . Hospitalizations, surgeries, and ER visits in previous 12 months . Vitals . Screenings to include cognitive, depression, and falls . Referrals and appointments  In addition, I have reviewed and discussed with patient certain preventive protocols, quality metrics, and best practice recommendations. A written personalized care plan for preventive services as well as general preventive health recommendations were provided to patient.     [name], LPN   2/0/1007   Nurse Notes: none

## 2021-03-11 NOTE — Patient Instructions (Signed)
Laura Kent , Thank you for taking time to come for your Medicare Wellness Visit. I appreciate your ongoing commitment to your health goals. Please review the following plan we discussed and let me know if I can assist you in the future.   Screening recommendations/referrals: Colonoscopy: done 01/17/17. Repeat in 2028 Mammogram: done 12/15/20 Bone Density: done 01/10/18 Recommended yearly ophthalmology/optometry visit for glaucoma screening and checkup Recommended yearly dental visit for hygiene and checkup  Vaccinations: Influenza vaccine: done 11/04/20 Pneumococcal vaccine: done 02/25/20 Tdap vaccine: done 02/25/20 Shingles vaccine: Shingrix discussed. Please contact your pharmacy for coverage information.  Covid-19: done 01/15/20, 02/05/20 & 10/01/20  Advanced directives: Advance directive discussed with you today. I have provided a copy for you to complete at home and have notarized. Once this is complete please bring a copy in to our office so we can scan it into your chart.  Conditions/risks identified: If you wish to quit smoking, help is available. For free tobacco cessation program offerings call the [facility] at 7278483774 or Live Well Line at (602)174-4608. You may also visit [email or web address] or email [email or web address] for more information on other programs.   Next appointment: Follow up in one year for your annual wellness visit    Preventive Care 65 Years and Older, Female Preventive care refers to lifestyle choices and visits with your health care provider that can promote health and wellness. What does preventive care include?  A yearly physical exam. This is also called an annual well check.  Dental exams once or twice a year.  Routine eye exams. Ask your health care provider how often you should have your eyes checked.  Personal lifestyle choices, including:  Daily care of your teeth and gums.  Regular physical activity.  Eating a  healthy diet.  Avoiding tobacco and drug use.  Limiting alcohol use.  Practicing safe sex.  Taking low-dose aspirin every day.  Taking vitamin and mineral supplements as recommended by your health care provider. What happens during an annual well check? The services and screenings done by your health care provider during your annual well check will depend on your age, overall health, lifestyle risk factors, and family history of disease. Counseling  Your health care provider may ask you questions about your:  Alcohol use.  Tobacco use.  Drug use.  Emotional well-being.  Home and relationship well-being.  Sexual activity.  Eating habits.  History of falls.  Memory and ability to understand (cognition).  Work and work Statistician.  Reproductive health. Screening  You may have the following tests or measurements:  Height, weight, and BMI.  Blood pressure.  Lipid and cholesterol levels. These may be checked every 5 years, or more frequently if you are over 45 years old.  Skin check.  Lung cancer screening. You may have this screening every year starting at age 4 if you have a 30-pack-year history of smoking and currently smoke or have quit within the past 15 years.  Fecal occult blood test (FOBT) of the stool. You may have this test every year starting at age 51.  Flexible sigmoidoscopy or colonoscopy. You may have a sigmoidoscopy every 5 years or a colonoscopy every 10 years starting at age 65.  Hepatitis C blood test.  Hepatitis B blood test.  Sexually transmitted disease (STD) testing.  Diabetes screening. This is done by checking your blood sugar (glucose) after you have not eaten for a while (fasting). You may have this done every 1-3 years.  Bone  density scan. This is done to screen for osteoporosis. You may have this done starting at age 5.  Mammogram. This may be done every 1-2 years. Talk to your health care provider about how often you should  have regular mammograms. Talk with your health care provider about your test results, treatment options, and if necessary, the need for more tests. Vaccines  Your health care provider may recommend certain vaccines, such as:  Influenza vaccine. This is recommended every year.  Tetanus, diphtheria, and acellular pertussis (Tdap, Td) vaccine. You may need a Td booster every 10 years.  Zoster vaccine. You may need this after age 67.  Pneumococcal 13-valent conjugate (PCV13) vaccine. One dose is recommended after age 48.  Pneumococcal polysaccharide (PPSV23) vaccine. One dose is recommended after age 65. Talk to your health care provider about which screenings and vaccines you need and how often you need them. This information is not intended to replace advice given to you by your health care provider. Make sure you discuss any questions you have with your health care provider. Document Released: 12/18/2015 Document Revised: 08/10/2016 Document Reviewed: 09/22/2015 Elsevier Interactive Patient Education  2017 [place] Prevention in the Home Falls can cause injuries. They can happen to people of all ages. There are many things you can do to make your home safe and to help prevent falls. What can I do on the outside of my home?  Regularly fix the edges of walkways and driveways and fix any cracks.  Remove anything that might make you trip as you walk through a door, such as a raised step or threshold.  Trim any bushes or trees on the path to your home.  Use bright outdoor lighting.  Clear any walking paths of anything that might make someone trip, such as rocks or tools.  Regularly check to see if handrails are loose or broken. Make sure that both sides of any steps have handrails.  Any raised decks and porches should have guardrails on the edges.  Have any leaves, snow, or ice cleared regularly.  Use sand or salt on walking paths during winter.  Clean up any spills in  your garage right away. This includes oil or grease spills. What can I do in the bathroom?  Use night lights.  Install grab bars by the toilet and in the tub and shower. Do not use towel bars as grab bars.  Use non-skid mats or decals in the tub or shower.  If you need to sit down in the shower, use a plastic, non-slip stool.  Keep the floor dry. Clean up any water that spills on the floor as soon as it happens.  Remove soap buildup in the tub or shower regularly.  Attach bath mats securely with double-sided non-slip rug tape.  Do not have throw rugs and other things on the floor that can make you trip. What can I do in the bedroom?  Use night lights.  Make sure that you have a light by your bed that is easy to reach.  Do not use any sheets or blankets that are too big for your bed. They should not hang down onto the floor.  Have a firm chair that has side arms. You can use this for support while you get dressed.  Do not have throw rugs and other things on the floor that can make you trip. What can I do in the kitchen?  Clean up any spills right away.  Avoid walking on  wet floors.  Keep items that you use a lot in easy-to-reach places.  If you need to reach something above you, use a strong step stool that has a grab bar.  Keep electrical cords out of the way.  Do not use floor polish or wax that makes floors slippery. If you must use wax, use non-skid floor wax.  Do not have throw rugs and other things on the floor that can make you trip. What can I do with my stairs?  Do not leave any items on the stairs.  Make sure that there are handrails on both sides of the stairs and use them. Fix handrails that are broken or loose. Make sure that handrails are as long as the stairways.  Check any carpeting to make sure that it is firmly attached to the stairs. Fix any carpet that is loose or worn.  Avoid having throw rugs at the top or bottom of the stairs. If you do have  throw rugs, attach them to the floor with carpet tape.  Make sure that you have a light switch at the top of the stairs and the bottom of the stairs. If you do not have them, ask someone to add them for you. What else can I do to help prevent falls?  Wear shoes that:  Do not have high heels.  Have rubber bottoms.  Are comfortable and fit you well.  Are closed at the toe. Do not wear sandals.  If you use a stepladder:  Make sure that it is fully opened. Do not climb a closed stepladder.  Make sure that both sides of the stepladder are locked into place.  Ask someone to hold it for you, if possible.  Clearly mark and make sure that you can see:  Any grab bars or handrails.  First and last steps.  Where the edge of each step is.  Use tools that help you move around (mobility aids) if they are needed. These include:  Canes.  Walkers.  Scooters.  Crutches.  Turn on the lights when you go into a dark area. Replace any light bulbs as soon as they burn out.  Set up your furniture so you have a clear path. Avoid moving your furniture around.  If any of your floors are uneven, fix them.  If there are any pets around you, be aware of where they are.  Review your medicines with your doctor. Some medicines can make you feel dizzy. This can increase your chance of falling. Ask your doctor what other things that you can do to help prevent falls. This information is not intended to replace advice given to you by your health care provider. Make sure you discuss any questions you have with your health care provider. Document Released: 09/17/2009 Document Revised: 04/28/2016 Document Reviewed: 12/26/2014 Elsevier Interactive Patient Education  2017 [employer].

## 2021-04-19 ENCOUNTER — Other Ambulatory Visit: Payer: Self-pay

## 2021-04-19 ENCOUNTER — Encounter: Payer: Self-pay | Admitting: Family Medicine

## 2021-04-19 ENCOUNTER — Ambulatory Visit (INDEPENDENT_AMBULATORY_CARE_PROVIDER_SITE_OTHER): Payer: Medicare Other | Admitting: Family Medicine

## 2021-04-19 VITALS — BP 128/74 | HR 91 | Temp 98.5°F | Resp 18 | Ht 65.0 in | Wt 224.6 lb

## 2021-04-19 DIAGNOSIS — Z6836 Body mass index (BMI) 36.0-36.9, adult: Secondary | ICD-10-CM

## 2021-04-19 DIAGNOSIS — R7303 Prediabetes: Secondary | ICD-10-CM | POA: Diagnosis not present

## 2021-04-19 DIAGNOSIS — Z8249 Family history of ischemic heart disease and other diseases of the circulatory system: Secondary | ICD-10-CM

## 2021-04-19 DIAGNOSIS — I10 Essential (primary) hypertension: Secondary | ICD-10-CM

## 2021-04-19 DIAGNOSIS — Z5181 Encounter for therapeutic drug level monitoring: Secondary | ICD-10-CM

## 2021-04-19 DIAGNOSIS — E0590 Thyrotoxicosis, unspecified without thyrotoxic crisis or storm: Secondary | ICD-10-CM

## 2021-04-19 DIAGNOSIS — Z720 Tobacco use: Secondary | ICD-10-CM

## 2021-04-19 DIAGNOSIS — Z823 Family history of stroke: Secondary | ICD-10-CM

## 2021-04-19 DIAGNOSIS — Z780 Asymptomatic menopausal state: Secondary | ICD-10-CM

## 2021-04-19 NOTE — Progress Notes (Signed)
Name: Laura Kent   MRN: [id number]    DOB: [birth year]   Date:04/19/2021       Progress Note  Chief Complaint  Patient presents with  . Follow-up  . Hypertension     Subjective:   Laura Kent is a 70 y.o. female, presents to clinic for routine f/up  Prediabetes: Lab Results  Component Value Date   HGBA1C 6.3 (H) 02/17/2020  High over the past several years of 6.4, not on meds, manages with diet/lifestyle and monitoring  Hypertension:   Currently managed on amlodipine and valsartan-HCTZ Pt reports good med compliance and denies any SE.   Blood pressure today is well controlled. BP Readings from Last 3 Encounters:  04/19/21 128/74  03/11/21 132/76  08/19/20 126/76   Pt denies CP, SOB, exertional sx, LE edema, palpitation, Ha's, visual disturbances, lightheadedness, hypotension, syncope. Dietary efforts for BP?  Low salt  Hx of hyperthyroid, not on meds Current Symptoms: denies fatigue, weight changes, heat/cold intolerance, bowel/skin changes or CVS symptoms Most recent results are below; we will be repeating labs today. 08/20/20 0042  TSH + free T4   Collected: 08/19/20 1055  Final result   TSH W/REFLEX TO FT4 4.21 mIU/L     Lab Results  Component Value Date   TSH 3.370 08/19/2019   Hypercalcemia previously, normalized when last rechecked On calcium and vit D - total 600 mg calcium, Vit D two different supplements doesn't take both every day    Obesity: Wt Readings from Last 5 Encounters:  04/19/21 224 lb 9.6 oz (101.9 kg)  03/11/21 225 lb 1.6 oz (102.1 kg)  08/19/20 226 lb 1.6 oz (102.6 kg)  02/17/20 226 lb 1.6 oz (102.6 kg)  08/19/19 216 lb 11.2 oz (98.3 kg)   BMI Readings from Last 5 Encounters:  04/19/21 37.38 kg/m  03/11/21 37.46 kg/m  08/19/20 37.63 kg/m  02/17/20 37.63 kg/m  08/19/19 36.06 kg/m   Significant family hx of DM, heart disease, stroke (fatal in brother) in mother, father and brother Pt is on daily asa 81 mg Not on  statin, last cholesterol panel was very low Lab Results  Component Value Date   CHOL 111 08/19/2019   HDL 43 08/19/2019   LDLCALC 53 08/19/2019   TRIG 69 08/19/2019   CHOLHDL 2.6 08/19/2019      Current Outpatient Medications:  .  amLODipine (NORVASC) 5 MG tablet, TAKE 1 TABLET BY MOUTH  DAILY, Disp: 90 tablet, Rfl: 3 .  aspirin EC 81 MG tablet, Take 81 mg by mouth daily., Disp: , Rfl:  .  Calcium Carbonate-Vitamin D 600-125 MG-UNIT TABS, Take by mouth daily., Disp: , Rfl:  .  cholecalciferol (VITAMIN D) 1000 units tablet, Take 1,000 Units by mouth daily as needed., Disp: , Rfl:  .  Turmeric 500 MG CAPS, Take by mouth., Disp: , Rfl:  .  valsartan-hydrochlorothiazide (DIOVAN-HCT) 160-25 MG tablet, TAKE 1 TABLET BY MOUTH  DAILY, Disp: 90 tablet, Rfl: 3  Patient Active Problem List   Diagnosis Date Noted  . Hypercalcemia 08/19/2020  . Posterior tibial tendinitis 08/19/2020  . Prediabetes 08/19/2019  . Tobacco abuse 11/24/2017  . Hyperthyroidism 04/30/2017  . Benign hypertension 05/19/2015  . Obesity 05/19/2015    Past Surgical History:  Procedure Laterality Date  . BREAST CYST ASPIRATION Right 2011  . BUNIONECTOMY    . CHOLECYSTECTOMY  Oct 2014  . COLONOSCOPY WITH PROPOFOL N/A 01/17/2017   Procedure: COLONOSCOPY WITH PROPOFOL;  Surgeon: Robert Bellow, MD;  Location: [facility] ENDOSCOPY;  Service: Endoscopy;  Laterality: N/A;  . POSTERIOR TIBIAL TENDON REPAIR Left 10/26/2018    Family History  Problem Relation Age of Onset  . Diabetes Mother   . Kidney disease Mother   . Heart disease Mother   . Hypertension Mother   . Diabetes Father   . Heart disease Father   . Hypertension Father   . Cancer Brother        leukemia  . Heart disease Brother        tachycardia  . Stroke Brother        fatal  . Diabetes Sister   . Colon cancer Maternal Aunt   . Diabetes Maternal Grandmother   . Breast cancer Neg Hx     Social History   Tobacco Use  . Smoking status: Current  Every Day Smoker    Packs/day: 1.00    Years: 34.00    Pack years: 34.00    Types: Cigarettes    Last attempt to quit: 05/18/2012    Years since quitting: 8.9  . Smokeless tobacco: Never Used  Vaping Use  . Vaping Use: Never used  Substance Use Topics  . Alcohol use: No  . Drug use: No     No Known Allergies  Health Maintenance  Topic Date Due  . DEXA SCAN  01/11/2020  . INFLUENZA VACCINE  07/05/2021  . MAMMOGRAM  12/15/2021  . COLONOSCOPY (Pts 45-54yrs Insurance coverage will need to be confirmed)  01/17/2027  . TETANUS/TDAP  02/24/2030  . COVID-19 Vaccine  Completed  . Hepatitis C Screening  Completed  . PNA vac Low Risk Adult  Completed  . HPV VACCINES  Aged Out    Chart Review Today: I personally reviewed active problem list, medication list, allergies, family history, social history, health maintenance, notes from last encounter, lab results, imaging with the patient/caregiver today.   Review of Systems  Constitutional: Negative.   HENT: Negative.   Eyes: Negative.   Respiratory: Negative.   Cardiovascular: Negative.   Gastrointestinal: Negative.   Endocrine: Negative.   Genitourinary: Negative.   Musculoskeletal: Negative.   Skin: Negative.   Allergic/Immunologic: Negative.   Neurological: Negative.   Hematological: Negative.   Psychiatric/Behavioral: Negative.   All other systems reviewed and are negative.    Objective:   Vitals:   04/19/21 1045  BP: 128/74  Pulse: 91  Resp: 18  Temp: 98.5 F (36.9 C)  SpO2: 97%  Weight: 224 lb 9.6 oz (101.9 kg)  Height: 5\' 5"  (1.651 m)    Body mass index is 37.38 kg/m.  Physical Exam Vitals and nursing note reviewed.  Constitutional:      General: She is not in acute distress.    Appearance: Normal appearance. She is well-developed. She is not ill-appearing, toxic-appearing or diaphoretic.     Interventions: Face mask in place.  HENT:     Head: Normocephalic and atraumatic.     Right Ear: External  ear normal.     Left Ear: External ear normal.  Eyes:     General: Lids are normal. No scleral icterus.       Right eye: No discharge.        Left eye: No discharge.     Conjunctiva/sclera: Conjunctivae normal.  Neck:     Trachea: Phonation normal. No tracheal deviation.  Cardiovascular:     Rate and Rhythm: Normal rate and regular rhythm.     Pulses: Normal pulses.  Radial pulses are 2+ on the right side and 2+ on the left side.     Heart sounds: Normal heart sounds. No murmur heard. No friction rub. No gallop.   Pulmonary:     Effort: Pulmonary effort is normal. No respiratory distress.     Breath sounds: Normal breath sounds. No stridor. No wheezing, rhonchi or rales.  Chest:     Chest wall: No tenderness.  Abdominal:     General: Bowel sounds are normal. There is no distension.     Palpations: Abdomen is soft.  Musculoskeletal:     Right lower leg: 1+ Pitting Edema present.     Left lower leg: 1+ Pitting Edema present.  Skin:    General: Skin is warm and dry.     Coloration: Skin is not jaundiced or pale.     Findings: No rash.  Neurological:     Mental Status: She is alert.     Motor: No abnormal muscle tone.     Gait: Gait normal.  Psychiatric:        Mood and Affect: Mood normal.        Speech: Speech normal.        Behavior: Behavior normal.         Assessment & Plan:     ICD-10-CM   1. Benign hypertension  P53 COMPLETE METABOLIC PANEL WITH GFR   stable, well controlled, BP at goal, continue meds and avoid salt  2. Class 2 severe obesity with serious comorbidity and body mass index (BMI) of 36.0 to 36.9 in adult, unspecified obesity type (HCC)  E66.01 Lipid panel   Z68.36 Hemoglobin A1C    TSH + free T4    COMPLETE METABOLIC PANEL WITH GFR   mutliple comorbidities - HTN, HLD, prediabetes  3. Hyperthyroidism  E05.90 CBC with Differential/Platelet    TSH + free T4   recheck labs, no current sx concerning for hyperthyroid  4. Prediabetes  R73.03  Hemoglobin A1C    COMPLETE METABOLIC PANEL WITH GFR  5. Tobacco abuse  Z72.0 CBC with Differential/Platelet   offered smoking cessation resources, declined  6. Family history of heart disease  Z82.49 Lipid panel    Hemoglobin A1C  7. Family history of stroke  Z82.3 Lipid panel    Hemoglobin A1C  8. Medication monitoring encounter  Z51.81 Lipid panel    Hemoglobin A1C    CBC with Differential/Platelet    TSH + free T4    COMPLETE METABOLIC PANEL WITH GFR  9. Postmenopausal estrogen deficiency  Z78.0 DG Bone Density   encouraged dexa, supplements and weight bearing exercises   Health Maintenance  Topic Date Due  . DEXA SCAN  01/11/2020  . INFLUENZA VACCINE  07/05/2021  . MAMMOGRAM  12/15/2021  . COLONOSCOPY (Pts 45-64yrs Insurance coverage will need to be confirmed)  01/17/2027  . TETANUS/TDAP  02/24/2030  . COVID-19 Vaccine  Completed  . Hepatitis C Screening  Completed  . PNA vac Low Risk Adult  Completed  . HPV VACCINES  Aged Out    Return in about 6 months (around 10/20/2021) for Routine follow-up.   Delsa Grana, PA-C 04/19/21 11:06 AM

## 2021-04-19 NOTE — Patient Instructions (Addendum)
Health Maintenance  Topic Date Due  . DEXA scan (bone density measurement)  01/11/2020  . Flu Shot  07/05/2021  . Mammogram  12/15/2021  . Colon Cancer Screening  01/17/2027  . Tetanus Vaccine  02/24/2030  . COVID-19 Vaccine  Completed  . Hepatitis C Screening: USPSTF Recommendation to screen - Ages 32-69 yo.  Completed  . Pneumonia vaccines  Completed  . HPV Vaccine  [place] [facility] [place],  [place]  [address] Get Driving Directions Main: [phone]  I recommend getting your bone density done with your next mammogram.  You do have slightly higher risk of bone density changing with history of smoking and with your BMI.  Last dexa was normal which is great!  With Medicare you can repeat the screening every 2 years.  So you are able to do it at any time since it was last done in 2019.   Heart-Healthy Eating Plan Heart-healthy meal planning includes:  Eating less unhealthy fats.  Eating more healthy fats.  Making other changes in your diet. Talk with your doctor or a diet specialist (dietitian) to create an eating plan that is right for you.   What are tips for following this plan? Cooking Avoid frying your food. Try to bake, boil, grill, or broil it instead. You can also reduce fat by:  Removing the skin from poultry.  Removing all visible fats from meats.  Steaming vegetables in water or broth. Meal planning  At meals, divide your plate into four equal parts: ? Fill one-half of your plate with vegetables and green salads. ? Fill one-fourth of your plate with whole grains. ? Fill one-fourth of your plate with lean protein foods.  Eat 4-5 servings of vegetables per day. A serving of vegetables is: ? 1 cup of raw or cooked vegetables. ? 2 cups of raw leafy greens.  Eat 4-5 servings of fruit per day. A serving of fruit is: ? 1 medium whole fruit. ?  cup of dried fruit. ?  cup of fresh, frozen, or canned  fruit. ?  cup of 100% fruit juice.  Eat more foods that have soluble fiber. These are apples, broccoli, carrots, beans, peas, and barley. Try to get 20-30 g of fiber per day.  Eat 4-5 servings of nuts, legumes, and seeds per week: ? 1 serving of dried beans or legumes equals  cup after being cooked. ? 1 serving of nuts is  cup. ? 1 serving of seeds equals 1 tablespoon.   General information  Eat more home-cooked food. Eat less restaurant, buffet, and fast food.  Limit or avoid alcohol.  Limit foods that are high in starch and sugar.  Avoid fried foods.  Lose weight if you are overweight.  Keep track of how much salt (sodium) you eat. This is important if you have high blood pressure. Ask your doctor to tell you more about this.  Try to add vegetarian meals each week. Fats  Choose healthy fats. These include olive oil and canola oil, flaxseeds, walnuts, almonds, and seeds.  Eat more omega-3 fats. These include salmon, mackerel, sardines, tuna, flaxseed oil, and ground flaxseeds. Try to eat fish at least 2 times each week.  Check food labels. Avoid foods with trans fats or high amounts of saturated fat.  Limit saturated fats. ? These are often found in animal products, such as meats, butter, and cream. ? These are also found in plant foods, such as  palm oil, palm kernel oil, and coconut oil.  Avoid foods with partially hydrogenated oils in them. These have trans fats. Examples are stick margarine, some tub margarines, cookies, crackers, and other baked goods. What foods can I eat? Fruits All fresh, canned (in natural juice), or frozen fruits. Vegetables Fresh or frozen vegetables (raw, steamed, roasted, or grilled). Green salads. Grains Most grains. Choose whole wheat and whole grains most of the time. Rice and pasta, including brown rice and pastas made with whole wheat. Meats and other proteins Lean, well-trimmed beef, veal, pork, and lamb. Chicken and [place] without  skin. All fish and shellfish. Wild duck, rabbit, pheasant, and venison. Egg whites or low-cholesterol egg substitutes. Dried beans, peas, lentils, and tofu. Seeds and most nuts. Dairy Low-fat or nonfat cheeses, including ricotta and mozzarella. Skim or 1% milk that is liquid, powdered, or evaporated. Buttermilk that is made with low-fat milk. Nonfat or low-fat yogurt. Fats and oils Non-hydrogenated (trans-free) margarines. Vegetable oils, including soybean, sesame, sunflower, olive, peanut, safflower, corn, canola, and cottonseed. Salad dressings or mayonnaise made with a vegetable oil. Beverages Mineral water. Coffee and tea. Diet carbonated beverages. Sweets and desserts Sherbet, gelatin, and fruit ice. Small amounts of dark chocolate. Limit all sweets and desserts. Seasonings and condiments All seasonings and condiments. The items listed above may not be a complete list of foods and drinks you can eat. Contact a dietitian for more options. What foods should I avoid? Fruits Canned fruit in heavy syrup. Fruit in cream or butter sauce. Fried fruit. Limit coconut. Vegetables Vegetables cooked in cheese, cream, or butter sauce. Fried vegetables. Grains Breads that are made with saturated or trans fats, oils, or whole milk. Croissants. Sweet rolls. Donuts. High-fat crackers, such as cheese crackers. Meats and other proteins Fatty meats, such as hot dogs, ribs, sausage, bacon, rib-eye roast or steak. High-fat deli meats, such as salami and bologna. Caviar. Domestic duck and goose. Organ meats, such as liver. Dairy Cream, sour cream, cream cheese, and creamed cottage cheese. Whole-milk cheeses. Whole or 2% milk that is liquid, evaporated, or condensed. Whole buttermilk. Cream sauce or high-fat cheese sauce. Yogurt that is made from whole milk. Fats and oils Meat fat, or shortening. Cocoa butter, hydrogenated oils, palm oil, coconut oil, palm kernel oil. Solid fats and shortenings, including bacon  fat, salt pork, lard, and butter. Nondairy cream substitutes. Salad dressings with cheese or sour cream. Beverages Regular sodas and juice drinks with added sugar. Sweets and desserts Frosting. Pudding. Cookies. Cakes. Pies. Milk chocolate or white chocolate. Buttered syrups. Full-fat ice cream or ice cream drinks. The items listed above may not be a complete list of foods and drinks to avoid. Contact a dietitian for more information. Summary  Heart-healthy meal planning includes eating less unhealthy fats, eating more healthy fats, and making other changes in your diet.  Eat a balanced diet. This includes fruits and vegetables, low-fat or nonfat dairy, lean protein, nuts and legumes, whole grains, and heart-healthy oils and fats. This information is not intended to replace advice given to you by your health care provider. Make sure you discuss any questions you have with your health care provider. Document Revised: 01/25/2018 Document Reviewed: 12/29/2017 Elsevier Patient Education  2021 [employer].

## 2021-04-20 LAB — CBC WITH DIFFERENTIAL/PLATELET
Absolute Monocytes: 449 cells/uL (ref 200–950)
Basophils Absolute: 20 cells/uL (ref 0–200)
Basophils Relative: 0.4 %
Eosinophils Absolute: 92 cells/uL (ref 15–500)
Eosinophils Relative: 1.8 %
HCT: 42.1 % (ref 35.0–45.0)
Hemoglobin: 13.9 g/dL (ref 11.7–15.5)
Lymphs Abs: 1826 cells/uL (ref 850–3900)
MCH: 29.6 pg (ref 27.0–33.0)
MCHC: 33 g/dL (ref 32.0–36.0)
MCV: 89.8 fL (ref 80.0–100.0)
MPV: 10.4 fL (ref 7.5–12.5)
Monocytes Relative: 8.8 %
Neutro Abs: 2713 cells/uL (ref 1500–7800)
Neutrophils Relative %: 53.2 %
Platelets: 273 10*3/uL (ref 140–400)
RBC: 4.69 10*6/uL (ref 3.80–5.10)
RDW: 12.7 % (ref 11.0–15.0)
Total Lymphocyte: 35.8 %
WBC: 5.1 10*3/uL (ref 3.8–10.8)

## 2021-04-20 LAB — LIPID PANEL
Cholesterol: 104 mg/dL (ref <200)
HDL: 43 mg/dL — ABNORMAL LOW (ref >=50)
LDL Cholesterol (Calc): 46 mg/dL (calc)
Non-HDL Cholesterol (Calc): 61 mg/dL (calc) (ref <130)
Total CHOL/HDL Ratio: 2.4 (calc) (ref <5.0)
Triglycerides: 66 mg/dL (ref <150)

## 2021-04-20 LAB — COMPLETE METABOLIC PANEL WITH GFR
AG Ratio: 1.6 (calc) (ref 1.0–2.5)
ALT: 14 U/L (ref 6–29)
AST: 15 U/L (ref 10–35)
Albumin: 4.1 g/dL (ref 3.6–5.1)
Alkaline phosphatase (APISO): 78 U/L (ref 37–153)
BUN: 16 mg/dL (ref 7–25)
CO2: 30 mmol/L (ref 20–32)
Calcium: 10.3 mg/dL (ref 8.6–10.4)
Chloride: 105 mmol/L (ref 98–110)
Creat: 0.6 mg/dL (ref 0.50–0.99)
GFR, Est African American: 109 mL/min/{1.73_m2} (ref >=60)
GFR, Est Non African American: 94 mL/min/{1.73_m2} (ref >=60)
Globulin: 2.5 g/dL (calc) (ref 1.9–3.7)
Glucose, Bld: 89 mg/dL (ref 65–99)
Potassium: 4.5 mmol/L (ref 3.5–5.3)
Sodium: 141 mmol/L (ref 135–146)
Total Bilirubin: 0.4 mg/dL (ref 0.2–1.2)
Total Protein: 6.6 g/dL (ref 6.1–8.1)

## 2021-04-20 LAB — TSH+FREE T4: TSH W/REFLEX TO FT4: 2.45 mIU/L (ref 0.40–4.50)

## 2021-04-20 LAB — HEMOGLOBIN A1C
Hgb A1c MFr Bld: 6.1 % of total Hgb — ABNORMAL HIGH (ref <5.7)
Mean Plasma Glucose: 128 mg/dL
eAG (mmol/L): 7.1 mmol/L

## 2021-06-20 ENCOUNTER — Other Ambulatory Visit: Payer: Self-pay | Admitting: Family Medicine

## 2021-06-20 DIAGNOSIS — I10 Essential (primary) hypertension: Secondary | ICD-10-CM

## 2021-10-04 DIAGNOSIS — Z23 Encounter for immunization: Secondary | ICD-10-CM

## 2021-10-21 ENCOUNTER — Other Ambulatory Visit: Payer: Self-pay

## 2021-10-21 ENCOUNTER — Ambulatory Visit (INDEPENDENT_AMBULATORY_CARE_PROVIDER_SITE_OTHER): Payer: Medicare Other | Admitting: Unknown Physician Specialty

## 2021-10-21 ENCOUNTER — Encounter: Payer: Self-pay | Admitting: Unknown Physician Specialty

## 2021-10-21 DIAGNOSIS — E0590 Thyrotoxicosis, unspecified without thyrotoxic crisis or storm: Secondary | ICD-10-CM | POA: Diagnosis not present

## 2021-10-21 DIAGNOSIS — I10 Essential (primary) hypertension: Secondary | ICD-10-CM | POA: Diagnosis not present

## 2021-10-21 DIAGNOSIS — Z720 Tobacco use: Secondary | ICD-10-CM

## 2021-10-21 NOTE — Assessment & Plan Note (Signed)
Stable, continue present medications.   

## 2021-10-21 NOTE — Assessment & Plan Note (Signed)
Setting quit date of 11/28.  States she is going to go "cold [place]."  Resources of quit line discussed

## 2021-10-21 NOTE — Progress Notes (Signed)
BP 126/68   Pulse 96   Temp 98.3 F (36.8 C) (Oral)   Resp 16   Ht 5\' 5"  (1.651 m)   Wt 220 lb 11.2 oz (100.1 kg)   SpO2 99%   BMI 36.73 kg/m    Subjective:    Patient ID: Laura Kent, female    DOB: 02-16-1951, 70 y.o.   MRN: [id number]  HPI: SHAASIA ODLE is a 70 y.o. female  Chief Complaint  Patient presents with   Follow-up   Hypertension   Hyperthyroidism   Hypertension Using Amlodipine and Valsartan without difficulty Average home BPs - Good when she checks   No problems or lightheadedness No chest pain with exertion or shortness of breath No Edema LDL below 80 not on meds  Relevant past medical, surgical, family and social history reviewed and updated as indicated. Interim medical history since our last visit reviewed. Allergies and medications reviewed and updated.  Review of Systems  Per HPI unless specifically indicated above     Objective:    BP 126/68   Pulse 96   Temp 98.3 F (36.8 C) (Oral)   Resp 16   Ht 5\' 5"  (1.651 m)   Wt 220 lb 11.2 oz (100.1 kg)   SpO2 99%   BMI 36.73 kg/m   Wt Readings from Last 3 Encounters:  10/21/21 220 lb 11.2 oz (100.1 kg)  04/19/21 224 lb 9.6 oz (101.9 kg)  03/11/21 225 lb 1.6 oz (102.1 kg)    Physical Exam Constitutional:      General: She is not in acute distress.    Appearance: Normal appearance. She is well-developed.  HENT:     Head: Normocephalic and atraumatic.  Eyes:     General: Lids are normal. No scleral icterus.       Right eye: No discharge.        Left eye: No discharge.     Conjunctiva/sclera: Conjunctivae normal.  Neck:     Vascular: No carotid bruit or JVD.  Cardiovascular:     Rate and Rhythm: Normal rate and regular rhythm.     Heart sounds: Normal heart sounds.  Pulmonary:     Effort: Pulmonary effort is normal. No respiratory distress.     Breath sounds: Normal breath sounds.  Abdominal:     Palpations: There is no hepatomegaly or splenomegaly.  Musculoskeletal:         General: Normal range of motion.     Cervical back: Normal range of motion and neck supple.  Skin:    General: Skin is warm and dry.     Coloration: Skin is not pale.     Findings: No rash.  Neurological:     Mental Status: She is alert and oriented to person, place, and time.  Psychiatric:        Behavior: Behavior normal.        Thought Content: Thought content normal.        Judgment: Judgment normal.    Results for orders placed or performed in visit on 04/19/21  Lipid panel  Result Value Ref Range   Cholesterol 104 <200 mg/dL   HDL 43 (L) > OR = 50 mg/dL   Triglycerides 66 <150 mg/dL   LDL Cholesterol (Calc) 46 mg/dL (calc)   Total CHOL/HDL Ratio 2.4 <5.0 (calc)   Non-HDL Cholesterol (Calc) 61 <130 mg/dL (calc)  Hemoglobin A1C  Result Value Ref Range   Hgb A1c MFr Bld 6.1 (H) <5.7 % of total  Hgb   Mean Plasma Glucose 128 mg/dL   eAG (mmol/L) 7.1 mmol/L  CBC with Differential/Platelet  Result Value Ref Range   WBC 5.1 3.8 - 10.8 Thousand/uL   RBC 4.69 3.80 - 5.10 Million/uL   Hemoglobin 13.9 11.7 - 15.5 g/dL   HCT 42.1 35.0 - 45.0 %   MCV 89.8 80.0 - 100.0 fL   MCH 29.6 27.0 - 33.0 pg   MCHC 33.0 32.0 - 36.0 g/dL   RDW 12.7 11.0 - 15.0 %   Platelets 273 140 - 400 Thousand/uL   MPV 10.4 7.5 - 12.5 fL   Neutro Abs 2,713 1,500 - 7,800 cells/uL   Lymphs Abs 1,826 850 - 3,900 cells/uL   Absolute Monocytes 449 200 - 950 cells/uL   Eosinophils Absolute 92 15 - 500 cells/uL   Basophils Absolute 20 0 - 200 cells/uL   Neutrophils Relative % 53.2 %   Total Lymphocyte 35.8 %   Monocytes Relative 8.8 %   Eosinophils Relative 1.8 %   Basophils Relative 0.4 %  TSH + free T4  Result Value Ref Range   TSH W/REFLEX TO FT4 2.45 0.40 - 4.50 mIU/L  COMPLETE METABOLIC PANEL WITH GFR  Result Value Ref Range   Glucose, Bld 89 65 - 99 mg/dL   BUN 16 7 - 25 mg/dL   Creat 0.60 0.50 - 0.99 mg/dL   GFR, Est Non African American 94 > OR = 60 mL/min/1.94m2   GFR, Est African  American 109 > OR = 60 mL/min/1.81m2   BUN/Creatinine Ratio NOT APPLICABLE 6 - 22 (calc)   Sodium 141 135 - 146 mmol/L   Potassium 4.5 3.5 - 5.3 mmol/L   Chloride 105 98 - 110 mmol/L   CO2 30 20 - 32 mmol/L   Calcium 10.3 8.6 - 10.4 mg/dL   Total Protein 6.6 6.1 - 8.1 g/dL   Albumin 4.1 3.6 - 5.1 g/dL   Globulin 2.5 1.9 - 3.7 g/dL (calc)   AG Ratio 1.6 1.0 - 2.5 (calc)   Total Bilirubin 0.4 0.2 - 1.2 mg/dL   Alkaline phosphatase (APISO) 78 37 - 153 U/L   AST 15 10 - 35 U/L   ALT 14 6 - 29 U/L      Assessment & Plan:   Problem List Items Addressed This Visit       Unprioritized   Benign hypertension (Chronic)    Stable, continue present medications.        Hyperthyroidism    History of.  Stable TSH for last several years.  Recheck in 6 months      Tobacco abuse    Setting quit date of 11/28.  States she is going to go "cold [place]."  Resources of quit line discussed        Follow up plan: Return in about 6 months (around 04/20/2022).

## 2021-10-21 NOTE — Assessment & Plan Note (Signed)
History of.  Stable TSH for last several years.  Recheck in 6 months

## 2022-02-21 ENCOUNTER — Other Ambulatory Visit: Payer: Self-pay | Admitting: Family Medicine

## 2022-02-21 DIAGNOSIS — Z1231 Encounter for screening mammogram for malignant neoplasm of breast: Secondary | ICD-10-CM

## 2022-03-02 ENCOUNTER — Encounter: Payer: Self-pay | Admitting: Family Medicine

## 2022-03-02 ENCOUNTER — Ambulatory Visit (INDEPENDENT_AMBULATORY_CARE_PROVIDER_SITE_OTHER): Payer: Medicare Other | Admitting: Family Medicine

## 2022-03-02 VITALS — BP 134/70 | HR 99 | Temp 98.2°F | Resp 16 | Ht 65.0 in | Wt 226.4 lb

## 2022-03-02 DIAGNOSIS — H9392 Unspecified disorder of left ear: Secondary | ICD-10-CM

## 2022-03-02 NOTE — Progress Notes (Signed)
? ? ?  SUBJECTIVE:  ? ?CHIEF COMPLAINT / HPI:  ? ?EAR COMPLAINT ?- about a month ago, was cleaning ears with Qtips, had eyes closed and when pulled the Qtip out noticed the cotton swab tip was missing.since then has noted some muffled hearing, occasional pain.  ?Duration:  a little over a month ?Involved ear(s): left  ?Fever: no ?Otorrhea: no ?Upper respiratory infection symptoms: no ?Pruritus: no ?Hearing loss:  some muffled sounding ?Using Q-tips: yes ?Recurrent otitis media: no ?Treatments attempted: none ? ? ?OBJECTIVE:  ? ?BP 134/70   Pulse 99   Temp 98.2 ?F (36.8 ?C) (Oral)   Resp 16   Ht '5\' 5"'$  (1.651 m)   Wt 226 lb 6.4 oz (102.7 kg)   SpO2 99%   BMI 37.67 kg/m?   ?Gen: well appearing, in NAD ?HEENT: orophyarynx clear without exudate or erythema. Uvula midline. No tonsillar enlargement. Good dentition. TM visible b/l without bulging, purulence. Slight erythema to b/l canal without purulence, cerumen. No cervical or supraclavicular lymphadenopathy. ?Ext: WWP ? ? ?ASSESSMENT/PLAN:  ? ?Ear complaint ?No abnormalities on exam today. Possible Qtip tip fell out since initial incident. Recommend prn decongestant for occasional congestion/pain. Encouraged debrox instead of Qtip removal of cerumen. F/u prn.  ? ? ?Myles Gip, DO ?

## 2022-03-15 ENCOUNTER — Ambulatory Visit: Payer: Medicare Other

## 2022-03-22 ENCOUNTER — Ambulatory Visit (INDEPENDENT_AMBULATORY_CARE_PROVIDER_SITE_OTHER): Payer: Medicare Other

## 2022-03-22 DIAGNOSIS — Z0000 Encounter for general adult medical examination without abnormal findings: Secondary | ICD-10-CM

## 2022-03-22 NOTE — Progress Notes (Signed)
? ?Subjective:  ? Laura Kent is a 71 y.o. female who presents for Medicare Annual (Subsequent) preventive examination. ? ?Virtual Visit via Telephone Note ? ?I connected with  Laura Kent on 03/22/22 at  3:00 PM EDT by telephone and verified that I am speaking with the correct person using two identifiers. ? ?Location: ?Patient: home ?Provider: [facility] ?Persons participating in the virtual visit: patient/Nurse Health Advisor ?  ?I discussed the limitations, risks, security and privacy concerns of performing an evaluation and management service by telephone and the availability of in person appointments. The patient expressed understanding and agreed to proceed. ? ?Interactive audio and video telecommunications were attempted between this nurse and patient, however failed, due to patient having technical difficulties OR patient did not have access to video capability.  We continued and completed visit with audio only. ? ?Some vital signs may be absent or patient reported.  ? ?Laura Marker, LPN ? ? ?Review of Systems    ? ?  ? ?   ?Objective:  ?  ?There were no vitals filed for this visit. ?There is no height or weight on file to calculate BMI. ? ? ?  03/11/2021  ?  3:03 PM 04/24/2017  ?  9:05 AM 01/17/2017  ?  1:13 PM 11/21/2016  ?  8:43 AM 05/25/2016  ?  4:16 PM  ?Advanced Directives  ?Does Patient Have a Medical Advance Directive? No No No No No  ?Would patient like information on creating a medical advance directive? Yes (MAU/Ambulatory/Procedural Areas - Information given)    No - patient declined information  ? ? ?Current Medications (verified) ?Outpatient Encounter Medications as of 03/22/2022  ?Medication Sig  ? amLODipine (NORVASC) 5 MG tablet TAKE 1 TABLET BY MOUTH  DAILY  ? aspirin EC 81 MG tablet Take 81 mg by mouth daily.  ? Calcium Carbonate-Vitamin D 600-125 MG-UNIT TABS Take by mouth daily.  ? cholecalciferol (VITAMIN D) 1000 units tablet Take 1,000 Units by mouth daily as needed.  ? Turmeric 500 MG  CAPS Take by mouth.  ? valsartan-hydrochlorothiazide (DIOVAN-HCT) 160-25 MG tablet TAKE 1 TABLET BY MOUTH  DAILY  ? ?No facility-administered encounter medications on file as of 03/22/2022.  ? ? ?Allergies (verified) ?Patient has no known allergies.  ? ?History: ?Past Medical History:  ?Diagnosis Date  ? GERD (gastroesophageal reflux disease)   ? Hypertension   ? Hyperthyroidism   ? Treated with radioactive iodine 2014  ? IFG (impaired fasting glucose)   ? Menopausal syndrome   ? Neoplasm of uncertain behavior of skin 11/21/2016  ? Obesity   ? Plantar fascial fibromatosis of left foot   ? Plantar fasciitis 04/21/2017  ? Reflux   ? ?Past Surgical History:  ?Procedure Laterality Date  ? BREAST CYST ASPIRATION Right 2011  ? BUNIONECTOMY    ? CHOLECYSTECTOMY  Oct 2014  ? COLONOSCOPY WITH PROPOFOL N/A 01/17/2017  ? Procedure: COLONOSCOPY WITH PROPOFOL;  Surgeon: Robert Bellow, MD;  Location: [facility] ENDOSCOPY;  Service: Endoscopy;  Laterality: N/A;  ? POSTERIOR TIBIAL TENDON REPAIR Left 10/26/2018  ? ?Family History  ?Problem Relation Age of Onset  ? Diabetes Mother   ? Kidney disease Mother   ? Heart disease Mother   ? Hypertension Mother   ? Diabetes Father   ? Heart disease Father   ? Hypertension Father   ? Cancer Brother   ?     leukemia  ? Heart disease Brother   ?     tachycardia  ?  Stroke Brother   ?     fatal  ? Diabetes Sister   ? Colon cancer Maternal Aunt   ? Diabetes Maternal Grandmother   ? Breast cancer Neg Hx   ? ?Social History  ? ?Socioeconomic History  ? Marital status: Married  ?  Spouse name: Not on file  ? Number of children: Not on file  ? Years of education: Not on file  ? Highest education level: Not on file  ?Occupational History  ? Occupation: retired  ?Tobacco Use  ? Smoking status: Every Day  ?  Packs/day: 1.00  ?  Years: 34.00  ?  Pack years: 34.00  ?  Types: Cigarettes  ?  Last attempt to quit: 05/18/2012  ?  Years since quitting: 9.8  ? Smokeless tobacco: Never  ?Vaping Use  ? Vaping Use:  Never used  ?Substance and Sexual Activity  ? Alcohol use: No  ? Drug use: No  ? Sexual activity: Not Currently  ?Other Topics Concern  ? Not on file  ?Social History Narrative  ? Not on file  ? ?Social Determinants of Health  ? ?Financial Resource Strain: Not on file  ?Food Insecurity: Not on file  ?Transportation Needs: Not on file  ?Physical Activity: Not on file  ?Stress: Not on file  ?Social Connections: Not on file  ? ? ?Tobacco Counseling ?Ready to quit: Not Answered ?Counseling given: Not Answered ? ? ?Clinical Intake: ? ?  ? ?  ? ?  ? ?  ? ?  ? ? ?  ? ?  ? ? ?Activities of Daily Living ? ?  03/02/2022  ?  1:30 PM 10/21/2021  ?  9:39 AM  ?In your present state of health, do you have any difficulty performing the following activities:  ?Hearing? 0 0  ?Vision? 0 1  ?Difficulty concentrating or making decisions? 0 0  ?Walking or climbing stairs? 0 0  ?Dressing or bathing? 0 0  ?Doing errands, shopping? 0 0  ? ? ?Patient Care Team: ?Laura Grana, PA-C as PCP - General (Family Medicine) ? ?Indicate any recent Medical Services you may have received from other than [facility] providers in the past year (date may be approximate). ? ?   ?Assessment:  ? This is a routine wellness examination for Laura Kent. ? ?Hearing/Vision screen ?No results found. ? ?Dietary issues and exercise activities discussed: ?  ? ? Goals Addressed   ?None ?  ? ?Depression Screen ? ?  03/02/2022  ?  1:31 PM 10/21/2021  ?  9:39 AM 04/19/2021  ? 10:47 AM 03/11/2021  ?  3:01 PM 08/19/2020  ? 10:16 AM 02/17/2020  ? 11:03 AM 08/19/2019  ? 11:30 AM  ?PHQ 2/9 Scores  ?PHQ - 2 Score 0 0 0 0 0 0 1  ?PHQ- 9 Score 0 0 0   0 1  ?  ?Fall Risk ? ?  03/02/2022  ?  1:30 PM 10/21/2021  ?  9:39 AM 04/19/2021  ? 10:47 AM 03/11/2021  ?  3:04 PM 08/19/2020  ? 10:15 AM  ?Fall Risk   ?Falls in the past year? 0 0 0 0 0  ?Number falls in past yr: 0 0 0 0 0  ?Injury with Fall? 0 0 0 0 0  ?Risk for fall due to : No Fall Risks No Fall Risks  No Fall Risks   ?Follow up Falls prevention  discussed Falls prevention discussed  Falls prevention discussed Falls evaluation completed  ? ? ?FALL RISK  PREVENTION PERTAINING TO THE HOME: ? ?Any stairs in or around the home? Yes  ?If so, are there any without handrails? No  ?Home free of loose throw rugs in walkways, pet beds, electrical cords, etc? Yes  ?Adequate lighting in your home to reduce risk of falls? Yes  ? ?ASSISTIVE DEVICES UTILIZED TO PREVENT FALLS: ? ?Life alert? No  ?Use of a cane, walker or w/c? No  ?Grab bars in the bathroom? Yes  ?Shower chair or bench in shower? No  ?Elevated toilet seat or a handicapped toilet? No  ? ?TIMED UP AND GO: ? ?Was the test performed? No . Telephonic visit.  ? ?Cognitive Function: Normal cognitive status assessed by direct observation by this Nurse Health Advisor. No abnormalities found.  ? ?  ?  ?  ? ?Immunizations ?Immunization History  ?Administered Date(s) Administered  ? Fluad Quad(high Dose 65+) 11/04/2020  ? Influenza, High Dose Seasonal PF 09/24/2018, 10/04/2021  ? Influenza,inj,Quad PF,6+ Mos 10/04/2019  ? Influenza-Unspecified 08/12/2014, 08/06/2015, 09/24/2018  ? PFIZER(Purple Top)SARS-COV-2 Vaccination 01/15/2020, 02/05/2020, 09/29/2020  ? Pneumococcal Conjugate-13 10/16/2018  ? Pneumococcal Polysaccharide-23 08/24/2012, 02/25/2020  ? Td 08/05/2009  ? Tdap 02/25/2020  ? Zoster, Live 05/19/2015  ? ? ?TDAP status: Up to date ? ?Flu Vaccine status: Up to date ? ?Pneumococcal vaccine status: Up to date ? ?Covid-19 vaccine status: Completed vaccines ? ?Qualifies for Shingles Vaccine? Yes   ?Zostavax completed Yes   ?Shingrix Completed?: No.    Education has been provided regarding the importance of this vaccine. Patient has been advised to call insurance company to determine out of pocket expense if they have not yet received this vaccine. Advised may also receive vaccine at local pharmacy or Health Dept. Verbalized acceptance and understanding. ? ?Screening Tests ?Health Maintenance  ?Topic Date Due  ?  Zoster Vaccines- Shingrix (1 of 2) Never done  ? DEXA SCAN  01/11/2020  ? COVID-19 Vaccine (4 - Booster for [place] series) 11/24/2020  ? MAMMOGRAM  12/15/2021  ? INFLUENZA VACCINE  07/05/2022  ? COLONOSCOPY (Pts

## 2022-03-22 NOTE — Patient Instructions (Signed)
Laura Kent , ?Thank you for taking time to come for your Medicare Wellness Visit. I appreciate your ongoing commitment to your health goals. Please review the following plan we discussed and let me know if I can assist you in the future.  ? ?Screening recommendations/referrals: ?Colonoscopy: done 01/17/17. Repeat 01/2017 ?Mammogram: done 12/15/20. Scheduled for 04/13/22 ?Bone Density: done 01/10/18. Scheduled for 04/13/22 ?Recommended yearly ophthalmology/optometry visit for glaucoma screening and checkup ?Recommended yearly dental visit for hygiene and checkup ? ?Vaccinations: ?Influenza vaccine: done 10/04/21 ?Pneumococcal vaccine: done 02/25/20 ?Tdap vaccine: done 02/25/20 ?Shingles vaccine: Shingrix discussed. Please contact your pharmacy for coverage information.  ?Covid-19: done 01/15/20, 02/05/20, 09/29/20 & 10/04/21 ? ?Advanced directives: Advance directive discussed with you today. I have provided a copy for you to complete at home and have notarized. Once this is complete please bring a copy in to our office so we can scan it into your chart.  ? ?Conditions/risks identified: If you wish to quit smoking, help is available. For free tobacco cessation program offerings call the [facility] at [phone] or Live Well Line at [phone]. You may also visit [email or web address] or email [email or web address] for more information on other programs.  ? ?Next appointment: Follow up in one year for your annual wellness visit  ? ? ?Preventive Care 6 Years and Older, Female ?Preventive care refers to lifestyle choices and visits with your health care provider that can promote health and wellness. ?What does preventive care include? ?A yearly physical exam. This is also called an annual well check. ?Dental exams once or twice a year. ?Routine eye exams. Ask your health care provider how often you should have your eyes checked. ?Personal lifestyle choices, including: ?Daily care of your teeth and  gums. ?Regular physical activity. ?Eating a healthy diet. ?Avoiding tobacco and drug use. ?Limiting alcohol use. ?Practicing safe sex. ?Taking low-dose aspirin every day. ?Taking vitamin and mineral supplements as recommended by your health care provider. ?What happens during an annual well check? ?The services and screenings done by your health care provider during your annual well check will depend on your age, overall health, lifestyle risk factors, and family history of disease. ?Counseling  ?Your health care provider may ask you questions about your: ?Alcohol use. ?Tobacco use. ?Drug use. ?Emotional well-being. ?Home and relationship well-being. ?Sexual activity. ?Eating habits. ?History of falls. ?Memory and ability to understand (cognition). ?Work and work Statistician. ?Reproductive health. ?Screening  ?You may have the following tests or measurements: ?Height, weight, and BMI. ?Blood pressure. ?Lipid and cholesterol levels. These may be checked every 5 years, or more frequently if you are over 35 years old. ?Skin check. ?Lung cancer screening. You may have this screening every year starting at age 23 if you have a 30-pack-year history of smoking and currently smoke or have quit within the past 15 years. ?Fecal occult blood test (FOBT) of the stool. You may have this test every year starting at age 37. ?Flexible sigmoidoscopy or colonoscopy. You may have a sigmoidoscopy every 5 years or a colonoscopy every 10 years starting at age 29. ?Hepatitis C blood test. ?Hepatitis B blood test. ?Sexually transmitted disease (STD) testing. ?Diabetes screening. This is done by checking your blood sugar (glucose) after you have not eaten for a while (fasting). You may have this done every 1-3 years. ?Bone density scan. This is done to screen for osteoporosis. You may have this done starting at age 30. ?Mammogram. This may be done every 1-2 years. Talk to  your health care provider about how often you should have regular  mammograms. ?Talk with your health care provider about your test results, treatment options, and if necessary, the need for more tests. ?Vaccines  ?Your health care provider may recommend certain vaccines, such as: ?Influenza vaccine. This is recommended every year. ?Tetanus, diphtheria, and acellular pertussis (Tdap, Td) vaccine. You may need a Td booster every 10 years. ?Zoster vaccine. You may need this after age 24. ?Pneumococcal 13-valent conjugate (PCV13) vaccine. One dose is recommended after age 56. ?Pneumococcal polysaccharide (PPSV23) vaccine. One dose is recommended after age 22. ?Talk to your health care provider about which screenings and vaccines you need and how often you need them. ?This information is not intended to replace advice given to you by your health care provider. Make sure you discuss any questions you have with your health care provider. ?Document Released: 12/18/2015 Document Revised: 08/10/2016 Document Reviewed: 09/22/2015 ?Elsevier Interactive Patient Education ? 2017 [place]. ? ?Fall Prevention in the Home ?Falls can cause injuries. They can happen to people of all ages. There are many things you can do to make your home safe and to help prevent falls. ?What can I do on the outside of my home? ?Regularly fix the edges of walkways and driveways and fix any cracks. ?Remove anything that might make you trip as you walk through a door, such as a raised step or threshold. ?Trim any bushes or trees on the path to your home. ?Use bright outdoor lighting. ?Clear any walking paths of anything that might make someone trip, such as rocks or tools. ?Regularly check to see if handrails are loose or broken. Make sure that both sides of any steps have handrails. ?Any raised decks and porches should have guardrails on the edges. ?Have any leaves, snow, or ice cleared regularly. ?Use sand or salt on walking paths during winter. ?Clean up any spills in your garage right away. This includes oil  or grease spills. ?What can I do in the bathroom? ?Use night lights. ?Install grab bars by the toilet and in the tub and shower. Do not use towel bars as grab bars. ?Use non-skid mats or decals in the tub or shower. ?If you need to sit down in the shower, use a plastic, non-slip stool. ?Keep the floor dry. Clean up any water that spills on the floor as soon as it happens. ?Remove soap buildup in the tub or shower regularly. ?Attach bath mats securely with double-sided non-slip rug tape. ?Do not have throw rugs and other things on the floor that can make you trip. ?What can I do in the bedroom? ?Use night lights. ?Make sure that you have a light by your bed that is easy to reach. ?Do not use any sheets or blankets that are too big for your bed. They should not hang down onto the floor. ?Have a firm chair that has side arms. You can use this for support while you get dressed. ?Do not have throw rugs and other things on the floor that can make you trip. ?What can I do in the kitchen? ?Clean up any spills right away. ?Avoid walking on wet floors. ?Keep items that you use a lot in easy-to-reach places. ?If you need to reach something above you, use a strong step stool that has a grab bar. ?Keep electrical cords out of the way. ?Do not use floor polish or wax that makes floors slippery. If you must use wax, use non-skid floor wax. ?Do not  have throw rugs and other things on the floor that can make you trip. ?What can I do with my stairs? ?Do not leave any items on the stairs. ?Make sure that there are handrails on both sides of the stairs and use them. Fix handrails that are broken or loose. Make sure that handrails are as long as the stairways. ?Check any carpeting to make sure that it is firmly attached to the stairs. Fix any carpet that is loose or worn. ?Avoid having throw rugs at the top or bottom of the stairs. If you do have throw rugs, attach them to the floor with carpet tape. ?Make sure that you have a light  switch at the top of the stairs and the bottom of the stairs. If you do not have them, ask someone to add them for you. ?What else can I do to help prevent falls? ?Wear shoes that: ?Do not have high heels. ?Have r

## 2022-04-13 ENCOUNTER — Ambulatory Visit
Admission: RE | Admit: 2022-04-13 | Discharge: 2022-04-13 | Disposition: A | Discharge status: Home / Self Care | Payer: Medicare Other | Source: Ambulatory Visit | Attending: Family Medicine | Admitting: Family Medicine

## 2022-04-13 DIAGNOSIS — Z780 Asymptomatic menopausal state: Secondary | ICD-10-CM | POA: Diagnosis present

## 2022-04-13 DIAGNOSIS — Z1231 Encounter for screening mammogram for malignant neoplasm of breast: Secondary | ICD-10-CM | POA: Diagnosis present

## 2022-04-21 ENCOUNTER — Encounter: Payer: Self-pay | Admitting: Family Medicine

## 2022-04-21 ENCOUNTER — Ambulatory Visit (INDEPENDENT_AMBULATORY_CARE_PROVIDER_SITE_OTHER): Payer: Medicare Other | Admitting: Family Medicine

## 2022-04-21 VITALS — BP 124/66 | HR 80 | Resp 16 | Ht 65.0 in | Wt 225.0 lb

## 2022-04-21 DIAGNOSIS — R7303 Prediabetes: Secondary | ICD-10-CM | POA: Diagnosis not present

## 2022-04-21 DIAGNOSIS — Z720 Tobacco use: Secondary | ICD-10-CM

## 2022-04-21 DIAGNOSIS — I10 Essential (primary) hypertension: Secondary | ICD-10-CM | POA: Diagnosis not present

## 2022-04-21 DIAGNOSIS — E0590 Thyrotoxicosis, unspecified without thyrotoxic crisis or storm: Secondary | ICD-10-CM

## 2022-04-21 DIAGNOSIS — Z23 Encounter for immunization: Secondary | ICD-10-CM

## 2022-04-21 DIAGNOSIS — Z6836 Body mass index (BMI) 36.0-36.9, adult: Secondary | ICD-10-CM

## 2022-04-21 DIAGNOSIS — Z5181 Encounter for therapeutic drug level monitoring: Secondary | ICD-10-CM

## 2022-04-21 DIAGNOSIS — Z6837 Body mass index (BMI) 37.0-37.9, adult: Secondary | ICD-10-CM

## 2022-04-21 MED ORDER — ZOSTER VAC RECOMB ADJUVANTED 50 MCG/0.5ML IM SUSR: INTRAMUSCULAR | 1 refills | Status: DC

## 2022-04-21 NOTE — Assessment & Plan Note (Signed)
with HTN, high ASCVD risk, current smoker, prediabetes - counseled on diet efforts

## 2022-04-21 NOTE — Assessment & Plan Note (Signed)
Treated with meds in the past, no sx of hyper or hypothyroid Monitoring labs

## 2022-04-21 NOTE — Progress Notes (Signed)
Name: Laura Kent   MRN: [id number]    DOB: 23-Oct-1951   Date:04/21/2022       Progress Note  Chief Complaint  Patient presents with   Follow-up     Subjective:   Laura Kent is a 71 y.o. female, presents to clinic for   Hypertension:  Currently managed on Norvasc and valsartan HCTZ Pt reports good med compliance and denies any SE.   Blood pressure today is well controlled. BP Readings from Last 3 Encounters:  04/21/22 124/66  03/02/22 134/70  10/21/21 126/68   Pt denies CP, SOB, exertional sx, LE edema, palpitation, Ha's, visual disturbances, lightheadedness, hypotension, syncope. Dietary efforts for BP?  none   No hx of HLD, she is a smoker, brother with heart disease but she states he had multiple other risk factors including drug use  Lab Results  Component Value Date   CHOL 104 04/19/2021   HDL 43 (L) 04/19/2021   LDLCALC 46 04/19/2021   TRIG 66 04/19/2021   CHOLHDL 2.4 04/19/2021  ASCVD risk calculator in Epic will not give value due to low total cholesterol - when done in [facility] CALC with lowest cholesterol allowed - risk is >17% - reviewed this with pt  Prediabetes, family hx of DM, she has not changed diet or attempted any other efforts to improve Recent pertinent labs: Lab Results  Component Value Date   HGBA1C 6.1 (H) 04/19/2021   HGBA1C 6.3 (H) 02/17/2020   HGBA1C 6.1 (H) 08/19/2019   Current 1 ppd smoker Does not want to quit right now Sometimes has a cough, no recurrent bronchitis, not currently doing CA screening program - not really interested Tried chantix in the past did not tolerate Smoking cessation instruction/counseling given:  counseled patient on the dangers of tobacco use, advised patient to stop smoking, and reviewed strategies to maximize success    Current Outpatient Medications:    amLODipine (NORVASC) 5 MG tablet, TAKE 1 TABLET BY MOUTH  DAILY, Disp: 90 tablet, Rfl: 3   aspirin EC 81 MG tablet, Take 81 mg by mouth daily.,  Disp: , Rfl:    cholecalciferol (VITAMIN D) 1000 units tablet, Take 1,000 Units by mouth daily as needed., Disp: , Rfl:    Turmeric 500 MG CAPS, Take by mouth., Disp: , Rfl:    valsartan-hydrochlorothiazide (DIOVAN-HCT) 160-25 MG tablet, TAKE 1 TABLET BY MOUTH  DAILY, Disp: 90 tablet, Rfl: 3  Patient Active Problem List   Diagnosis Date Noted   Hypercalcemia 08/19/2020   Posterior tibial tendinitis 08/19/2020   Prediabetes 08/19/2019   Tobacco abuse 11/24/2017   Hyperthyroidism 04/30/2017   Benign hypertension 05/19/2015   Obesity 05/19/2015    Past Surgical History:  Procedure Laterality Date   BREAST CYST ASPIRATION Right 2011   BUNIONECTOMY     CHOLECYSTECTOMY  09/2013   COLONOSCOPY WITH PROPOFOL N/A 01/17/2017   Procedure: COLONOSCOPY WITH PROPOFOL;  Surgeon: Robert Bellow, MD;  Location: [facility] ENDOSCOPY;  Service: Endoscopy;  Laterality: N/A;   EYE SURGERY  Feb 2023   Cataracts   POSTERIOR TIBIAL TENDON REPAIR Left 10/26/2018    Family History  Problem Relation Age of Onset   Diabetes Mother    Kidney disease Mother    Heart disease Mother    Hypertension Mother    Diabetes Father    Heart disease Father    Hypertension Father    Cancer Brother        leukemia   Heart disease Brother  tachycardia   Stroke Brother        fatal   Diabetes Sister    Colon cancer Maternal Aunt    Diabetes Maternal Grandmother    Diabetes Brother    Stroke Brother    Heart disease Brother    Heart disease Brother    Breast cancer Neg Hx     Social History   Tobacco Use   Smoking status: Every Day    Packs/day: 0.50    Years: 34.00    Pack years: 17.00    Types: Cigarettes    Last attempt to quit: 05/18/2012    Years since quitting: 9.9   Smokeless tobacco: Never   Tobacco comments:    Quit 2013 started back 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  Vaping Use   Vaping Use: Never used  Substance Use Topics   Alcohol use: No   Drug use: No     No Known Allergies  Health  Maintenance  Topic Date Due   Zoster Vaccines- Shingrix (1 of 2) Never done   INFLUENZA VACCINE  07/05/2022   MAMMOGRAM  04/14/2023   DEXA SCAN  04/13/2024   COLONOSCOPY (Pts 45-38yr Insurance coverage will need to be confirmed)  01/17/2027   TETANUS/TDAP  02/24/2030   Pneumonia Vaccine 70 Years old  Completed   COVID-19 Vaccine  Completed   Hepatitis C Screening  Completed   HPV VACCINES  Aged Out    Chart Review Today: I personally reviewed active problem list, medication list, allergies, family history, social history, health maintenance, notes from last encounter, lab results, imaging with the patient/caregiver today.   Review of Systems  Constitutional: Negative.   HENT: Negative.    Eyes: Negative.   Respiratory: Negative.    Cardiovascular: Negative.   Gastrointestinal: Negative.   Endocrine: Negative.   Genitourinary: Negative.   Musculoskeletal: Negative.   Skin: Negative.   Allergic/Immunologic: Negative.   Neurological: Negative.   Hematological: Negative.   Psychiatric/Behavioral: Negative.    All other systems reviewed and are negative.   Objective:   Vitals:   04/21/22 0833  BP: 124/66  Pulse: (!) 102  Resp: 16  SpO2: 97%  Weight: 225 lb (102.1 kg)  Height: '5\' 5"'$  (1.651 m)    Body mass index is 37.44 kg/m.  Physical Exam Vitals and nursing note reviewed.  Constitutional:      General: She is not in acute distress.    Appearance: Normal appearance. She is well-developed. She is obese. She is not ill-appearing, toxic-appearing or diaphoretic.     Interventions: Face mask in place.  HENT:     Head: Normocephalic and atraumatic.     Right Ear: External ear normal.     Left Ear: External ear normal.  Eyes:     General: Lids are normal. No scleral icterus.       Right eye: No discharge.        Left eye: No discharge.     Conjunctiva/sclera: Conjunctivae normal.  Neck:     Trachea: Phonation normal. No tracheal deviation.  Cardiovascular:      Rate and Rhythm: Normal rate and regular rhythm.     Pulses: Normal pulses.          Radial pulses are 2+ on the right side and 2+ on the left side.     Heart sounds: Normal heart sounds. No murmur heard.   No friction rub. No gallop.  Pulmonary:     Effort: Pulmonary effort is normal. No respiratory  distress.     Breath sounds: Normal breath sounds. No stridor. No wheezing, rhonchi or rales.  Chest:     Chest wall: No tenderness.  Abdominal:     General: Bowel sounds are normal. There is no distension.     Palpations: Abdomen is soft.  Musculoskeletal:     Right lower leg: No edema.     Left lower leg: No edema.  Skin:    General: Skin is warm and dry.     Coloration: Skin is not jaundiced or pale.     Findings: No rash.  Neurological:     Mental Status: She is alert.     Motor: No abnormal muscle tone.     Gait: Gait normal.  Psychiatric:        Mood and Affect: Mood normal.        Speech: Speech normal.        Behavior: Behavior normal.        Assessment & Plan:   Problem List Items Addressed This Visit       Cardiovascular and Mediastinum   Benign hypertension - Primary (Chronic)    Currently managed on Norvasc and valsartan HCTZ Pt reports good med compliance and denies any SE.   Blood pressure today is well controlled. BP Readings from Last 3 Encounters:  04/21/22 124/66  03/02/22 134/70  10/21/21 126/68  No concerning sx or med SE Continue same tx      Relevant Orders   COMPLETE METABOLIC PANEL WITH GFR     Endocrine   Hyperthyroidism    Treated with meds in the past, no sx of hyper or hypothyroid Monitoring labs       Relevant Orders   TSH   T4, free     Other   Class 2 severe obesity with serious comorbidity and body mass index (BMI) of 37.0 to 37.9 in adult [facility])    with HTN, high ASCVD risk, current smoker, prediabetes - counseled on diet efforts       Tobacco abuse    Current 1 ppd smoker Does not want to quit right now Sometimes  has a cough, no recurrent bronchitis, not currently doing CA screening program - not really interested Tried chantix in the past did not tolerate Smoking cessation instruction/counseling given:  counseled patient on the dangers of tobacco use, advised patient to stop smoking, and reviewed strategies to maximize success      Prediabetes    She denies prior efforts with diet to improve, strong family hx, will continue to monitor labs Lab Results  Component Value Date   HGBA1C 6.1 (H) 04/19/2021   HGBA1C 6.3 (H) 02/17/2020   HGBA1C 6.1 (H) 08/19/2019          Relevant Orders   COMPLETE METABOLIC PANEL WITH GFR   Hemoglobin A1c   Other Visit Diagnoses     Medication monitoring encounter       Relevant Orders   CBC with Differential/Platelet   COMPLETE METABOLIC PANEL WITH GFR   Lipid panel   Hemoglobin A1c   TSH   T4, free   Need for shingles vaccine       Relevant Medications   Zoster Vaccine Adjuvanted [facility]) injection        Return in about 6 months (around 10/22/2022) for Routine follow-up.   Delsa Grana, PA-C 04/21/22 8:48 AM

## 2022-04-21 NOTE — Assessment & Plan Note (Signed)
Current 1 ppd smoker Does not want to quit right now Sometimes has a cough, no recurrent bronchitis, not currently doing CA screening program - not really interested Tried chantix in the past did not tolerate Smoking cessation instruction/counseling given:  counseled patient on the dangers of tobacco use, advised patient to stop smoking, and reviewed strategies to maximize success

## 2022-04-21 NOTE — Assessment & Plan Note (Signed)
Currently managed on Norvasc and valsartan HCTZ Pt reports good med compliance and denies any SE.   Blood pressure today is well controlled. BP Readings from Last 3 Encounters:  04/21/22 124/66  03/02/22 134/70  10/21/21 126/68  No concerning sx or med SE Continue same tx

## 2022-04-21 NOTE — Assessment & Plan Note (Signed)
She denies prior efforts with diet to improve, strong family hx, will continue to monitor labs Lab Results  Component Value Date   HGBA1C 6.1 (H) 04/19/2021   HGBA1C 6.3 (H) 02/17/2020   HGBA1C 6.1 (H) 08/19/2019

## 2022-04-22 ENCOUNTER — Other Ambulatory Visit: Payer: Self-pay | Admitting: Family Medicine

## 2022-04-22 LAB — CBC WITH DIFFERENTIAL/PLATELET
Absolute Monocytes: 389 cells/uL (ref 200–950)
Basophils Absolute: 22 cells/uL (ref 0–200)
Basophils Relative: 0.4 %
Eosinophils Absolute: 108 cells/uL (ref 15–500)
Eosinophils Relative: 2 %
HCT: 43 % (ref 35.0–45.0)
Hemoglobin: 14.2 g/dL (ref 11.7–15.5)
Lymphs Abs: 2041 cells/uL (ref 850–3900)
MCH: 29.5 pg (ref 27.0–33.0)
MCHC: 33 g/dL (ref 32.0–36.0)
MCV: 89.4 fL (ref 80.0–100.0)
MPV: 10.6 fL (ref 7.5–12.5)
Monocytes Relative: 7.2 %
Neutro Abs: 2840 cells/uL (ref 1500–7800)
Neutrophils Relative %: 52.6 %
Platelets: 269 10*3/uL (ref 140–400)
RBC: 4.81 10*6/uL (ref 3.80–5.10)
RDW: 13.1 % (ref 11.0–15.0)
Total Lymphocyte: 37.8 %
WBC: 5.4 10*3/uL (ref 3.8–10.8)

## 2022-04-22 LAB — HEMOGLOBIN A1C
Hgb A1c MFr Bld: 6.1 % of total Hgb — ABNORMAL HIGH (ref <5.7)
Mean Plasma Glucose: 128 mg/dL
eAG (mmol/L): 7.1 mmol/L

## 2022-04-22 LAB — COMPLETE METABOLIC PANEL WITH GFR
AG Ratio: 1.5 (calc) (ref 1.0–2.5)
ALT: 13 U/L (ref 6–29)
AST: 13 U/L (ref 10–35)
Albumin: 4.1 g/dL (ref 3.6–5.1)
Alkaline phosphatase (APISO): 73 U/L (ref 37–153)
BUN: 15 mg/dL (ref 7–25)
CO2: 29 mmol/L (ref 20–32)
Calcium: 10.1 mg/dL (ref 8.6–10.4)
Chloride: 105 mmol/L (ref 98–110)
Creat: 0.7 mg/dL (ref 0.50–1.05)
Globulin: 2.7 g/dL (calc) (ref 1.9–3.7)
Glucose, Bld: 97 mg/dL (ref 65–99)
Potassium: 3.9 mmol/L (ref 3.5–5.3)
Sodium: 143 mmol/L (ref 135–146)
Total Bilirubin: 0.4 mg/dL (ref 0.2–1.2)
Total Protein: 6.8 g/dL (ref 6.1–8.1)
eGFR: 94 mL/min/{1.73_m2} (ref >=60)

## 2022-04-22 LAB — LIPID PANEL
Cholesterol: 116 mg/dL (ref <200)
HDL: 52 mg/dL (ref >=50)
LDL Cholesterol (Calc): 50 mg/dL (calc)
Non-HDL Cholesterol (Calc): 64 mg/dL (calc) (ref <130)
Total CHOL/HDL Ratio: 2.2 (calc) (ref <5.0)
Triglycerides: 63 mg/dL (ref <150)

## 2022-04-22 LAB — TSH: TSH: 4.18 mIU/L (ref 0.40–4.50)

## 2022-04-22 LAB — T4, FREE: Free T4: 1 ng/dL (ref 0.8–1.8)

## 2022-04-22 MED ORDER — ROSUVASTATIN CALCIUM 5 MG PO TABS: 5.0000 mg | ORAL_TABLET | Freq: Every day | ORAL | 3 refills | Status: DC

## 2022-05-23 ENCOUNTER — Other Ambulatory Visit: Payer: Self-pay | Admitting: Family Medicine

## 2022-05-23 DIAGNOSIS — I10 Essential (primary) hypertension: Secondary | ICD-10-CM

## 2022-09-21 ENCOUNTER — Ambulatory Visit: Payer: Self-pay

## 2022-09-21 NOTE — Telephone Encounter (Signed)
  Chief Complaint: insect bite  Symptoms: under R breast, dime sized, redness and pain Frequency: 2 weeks  Pertinent Negatives: Patient denies itching Disposition: '[]'$ ED /'[]'$ Urgent Care (no appt availability in office) / '[x]'$ Appointment(In office/virtual)/ '[]'$  [place] [facility]/ '[]'$ Home Care/ '[]'$ Refused Recommended Disposition /'[]'$ [place] [facility]/ '[]'$  Follow-up with PCP Additional Notes: pt is unsure what bit her but hasn't applied anything to area. States when she touches it feels like something stabbing her. Scheduled appt for 0920 tomorrow with PCP.   Reason for Disposition  [1] Red or very tender (to touch) area AND [2] getting larger over 48 hours after the bite  Answer Assessment - Initial Assessment Questions 2. ONSET: "When did you get bitten?"      2 weeks  3. LOCATION: "Where is the insect bite located?"      Under R breast  4. REDNESS: "Is the area red or pink?" If Yes, ask: "What size is area of redness?" (inches or cm). "When did the redness start?"     dark 5. PAIN: "Is there any pain?" If Yes, ask: "How bad is it?"  (Scale 1-10; or mild, moderate, severe)     Feels sharp pain when touching  6. ITCHING: "Does it itch?" If Yes, ask: "How bad is the itch?"    - MILD: doesn't interfere with normal activities   - MODERATE-SEVERE: interferes with work, school, sleep, or other activities      no 7. SWELLING: "How big is the swelling?" (inches, cm, or compare to coins)     About dime sized  Protocols used: Insect Bite-A-AH

## 2022-09-22 ENCOUNTER — Encounter: Payer: Self-pay | Admitting: Family Medicine

## 2022-09-22 ENCOUNTER — Ambulatory Visit (INDEPENDENT_AMBULATORY_CARE_PROVIDER_SITE_OTHER): Payer: Medicare Other | Admitting: Family Medicine

## 2022-09-22 VITALS — BP 136/74 | HR 98 | Temp 97.8°F | Resp 16 | Ht 65.0 in | Wt 221.3 lb

## 2022-09-22 DIAGNOSIS — R222 Localized swelling, mass and lump, trunk: Secondary | ICD-10-CM

## 2022-09-22 MED ORDER — DOXYCYCLINE HYCLATE 100 MG PO TABS: 100.0000 mg | ORAL_TABLET | Freq: Two times a day (BID) | ORAL | 0 refills | Status: AC

## 2022-09-22 NOTE — Progress Notes (Signed)
  Patient ID: Laura Kent, female    DOB: 05/11/1951, 70 y.o.   MRN: [id number]  PCP: Tapia, Leisa, PA-C  Chief Complaint  Patient presents with   Insect Bite    Pt believes insect bite under right breast, tender at touch    Subjective:   Laura Kent is a 70 y.o. female, presents to clinic with CC of the following:  HPI  She suspects bug bite or something under right breast in skin fold.  Its been there for a couple weeks, hurts if she pushes on it or tries to squeeze it -feels like a "pen poking it" No itching, no drainage.  She thinks its getting a little bigger.  No hx of abscesses, skin infections or MRSA    Patient Active Problem List   Diagnosis Date Noted   Prediabetes 08/19/2019   Tobacco abuse 11/24/2017   Hyperthyroidism 04/30/2017   Benign hypertension 05/19/2015   Class 2 severe obesity with serious comorbidity and body mass index (BMI) of 37.0 to 37.9 in adult (HCC) 05/19/2015      Current Outpatient Medications:    amLODipine (NORVASC) 5 MG tablet, TAKE 1 TABLET BY MOUTH  DAILY, Disp: 90 tablet, Rfl: 3   aspirin EC 81 MG tablet, Take 81 mg by mouth daily., Disp: , Rfl:    cholecalciferol (VITAMIN D) 1000 units tablet, Take 1,000 Units by mouth daily as needed., Disp: , Rfl:    rosuvastatin (CRESTOR) 5 MG tablet, Take 1 tablet (5 mg total) by mouth at bedtime., Disp: 90 tablet, Rfl: 3   Turmeric 500 MG CAPS, Take by mouth., Disp: , Rfl:    valsartan-hydrochlorothiazide (DIOVAN-HCT) 160-25 MG tablet, TAKE 1 TABLET BY MOUTH  DAILY, Disp: 90 tablet, Rfl: 3   Zoster Vaccine Adjuvanted (SHINGRIX) injection, Inject 0.5 mLs into the muscle once for 1 dose. 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   Review of Systems  Constitutional: Negative.   HENT: Negative.    Eyes: Negative.   Respiratory: Negative.    Cardiovascular: Negative.   Gastrointestinal: Negative.   Endocrine: Negative.   Genitourinary: Negative.   Musculoskeletal: Negative.   Skin: Negative.   Allergic/Immunologic: Negative.   Neurological: Negative.   Hematological: Negative.   Psychiatric/Behavioral: Negative.    All other systems reviewed and are negative.      Objective:   Vitals:   09/22/22 0923 09/22/22 0931  BP: (!) 150/88 136/74  Pulse: 98   Resp: 16   Temp: 97.8 F (36.6 C)   TempSrc: Oral   SpO2: 96%   Weight: 221 lb 4.8 oz (100.4 kg)   Height: 5' 5" (1.651 m)     Body   mass index is 36.83 kg/m.  Physical Exam Vitals and nursing note reviewed.  Constitutional:      General: She is not in acute distress.    Appearance: Normal appearance. She is well-developed and well-groomed. She is obese. She is not ill-appearing, toxic-appearing or diaphoretic.     Comments: Well appearing, pleasant  HENT:     Head: Normocephalic and atraumatic.     Nose: Nose normal.  Eyes:     General:        Right eye: No discharge.        Left eye: No discharge.     Conjunctiva/sclera: Conjunctivae normal.  Neck:     Trachea: No tracheal deviation.  Cardiovascular:     Rate and Rhythm: Normal rate and regular rhythm.  Pulmonary:     Effort: Pulmonary effort is normal. No respiratory distress.     Breath sounds: No stridor.  Musculoskeletal:        General: Normal range of motion.  Skin:     General: Skin is warm and dry.     Findings: No rash.     Comments: Right breast skin fold roughly 2 cm diameter area of hyperpigmentation with soft raised nodule, slightly fluctuant in central 1 cm, indurated edge circumferential less than 1 cm, w/o erythema, very minimally ttp  Neurological:     Mental Status: She is alert.     Motor: No abnormal muscle tone.     Coordination: Coordination normal.  Psychiatric:        Behavior: Behavior normal. Behavior is cooperative.      Results for orders placed or performed in visit on 04/21/22  CBC with Differential/Platelet  Result Value Ref Range   WBC 5.4 3.8 - 10.8 Thousand/uL   RBC 4.81 3.80 - 5.10 Million/uL   Hemoglobin 14.2 11.7 - 15.5 g/dL   HCT 43.0 35.0 - 45.0 %   MCV 89.4 80.0 - 100.0 fL   MCH 29.5 27.0 - 33.0 pg   MCHC 33.0 32.0 - 36.0 g/dL   RDW 13.1 11.0 - 15.0 %   Platelets 269 140 - 400 Thousand/uL   MPV 10.6 7.5 - 12.5 fL   Neutro Abs 2,840 1,500 - 7,800 cells/uL   Lymphs Abs 2,041 850 - 3,900 cells/uL   Absolute Monocytes 389 200 - 950 cells/uL   Eosinophils Absolute 108 15 - 500 cells/uL   Basophils Absolute 22 0 - 200 cells/uL   Neutrophils Relative % 52.6 %   Total Lymphocyte 37.8 %   Monocytes Relative 7.2 %   Eosinophils Relative 2.0 %   Basophils Relative 0.4 %  COMPLETE METABOLIC PANEL WITH GFR  Result Value Ref Range   Glucose, Bld 97 65 - 99 mg/dL   BUN 15 7 - 25 mg/dL   Creat 0.70 0.50 - 1.05 mg/dL   eGFR 94 > OR = 60 mL/min/1.89m   BUN/Creatinine Ratio NOT APPLICABLE 6 - 22 (calc)   Sodium 143 135 - 146 mmol/L   Potassium 3.9 3.5 - 5.3 mmol/L   Chloride 105 98 - 110 mmol/L   CO2 29 20 - 32 mmol/L   Calcium 10.1 8.6 - 10.4 mg/dL   Total Protein 6.8 6.1 - 8.1 g/dL   Albumin 4.1 3.6 - 5.1 g/dL   Globulin 2.7 1.9 - 3.7 g/dL (calc)   AG Ratio 1.5 1.0 - 2.5 (calc)   Total Bilirubin 0.4 0.2 - 1.2 mg/dL   Alkaline phosphatase (APISO) 73 37 - 153 U/L   AST  13 10 - 35 U/L   ALT 13 6 - 29 U/L  Lipid  panel  Result Value Ref Range   Cholesterol 116 <200 mg/dL   HDL 52 > OR = 50 mg/dL   Triglycerides 63 <150 mg/dL   LDL Cholesterol (Calc) 50 mg/dL (calc)   Total CHOL/HDL Ratio 2.2 <5.0 (calc)   Non-HDL Cholesterol (Calc) 64 <130 mg/dL (calc)  Hemoglobin A1c  Result Value Ref Range   Hgb A1c MFr Bld 6.1 (H) <5.7 % of total Hgb   Mean Plasma Glucose 128 mg/dL   eAG (mmol/L) 7.1 mmol/L  TSH  Result Value Ref Range   TSH 4.18 0.40 - 4.50 mIU/L  T4, free  Result Value Ref Range   Free T4 1.0 0.8 - 1.8 ng/dL       Assessment & Plan:   1. Nodule of skin of abdomen Cyst vs abscess - not very inflamed, given proximity to breast, minimal tenderness, do not feel I&D is indicated at this time.  Sx ongoing for a few weeks, will try abx and warm compresses to see if it will spontaneously drain or decrease in size on its own. Education and hand out given to pt - doxycycline (VIBRA-TABS) 100 MG tablet; Take 1 tablet (100 mg total) by mouth 2 (two) times daily for 7 days.  Dispense: 14 tablet; Refill: 0 Doxy to cover SSTI/MRSA Take for only 5 d if improved stop - if still inflamed, draining, or tender can complete the 7 d  Offered a 1 week f/up/recheck appt - she declined Encouraged her to come back if acutely inflamed and I&D may be needed at that time, she could also go to general surgery for excision or  I&D eval  Return precautions reviewed with pt, she verbalized understanding, written on AVS as well          Leisa Tapia, PA-C 09/22/22 9:36 AM  

## 2022-09-22 NOTE — Patient Instructions (Signed)
Warm soaks to the area 2-3 x a day for a few days while taking the antibiotics to see if it will start to drain on its own.   It may be a cyst/boil or a small abscess.  Come back if its spreading or worse - bigger, more painful, or not improving We sometimes do lance things like this or send to a surgical specialists  Skin Abscess  A skin abscess is an infected area on or under your skin that contains a collection of pus and other material. An abscess may also be called a furuncle, carbuncle, or boil. An abscess can occur in or on almost any part of your body. Some abscesses break open (rupture) on their own. Most continue to get worse unless they are treated. The infection can spread deeper into the body and eventually into your blood, which can make you feel ill. Treatment usually involves draining the abscess. What are the causes? An abscess occurs when germs, like bacteria, pass through your skin and cause an infection. This may be caused by: A scrape or cut on your skin. A puncture wound through your skin, including a needle injection or insect bite. Blocked oil or sweat glands. Blocked and infected hair follicles. A cyst that forms beneath your skin (sebaceous cyst) and becomes infected. What increases the risk? This condition is more likely to develop in people who: Have a weak body defense system (immune system). Have diabetes. Have dry and irritated skin. Get frequent injections or use illegal IV drugs. Have a foreign body in a wound, such as a splinter. Have problems with their lymph system or veins. What are the signs or symptoms? Symptoms of this condition include: A painful, firm bump under the skin. A bump with pus at the top. This may break through the skin and drain. Other symptoms include: Redness surrounding the abscess site. Warmth. Swelling of the lymph nodes (glands) near the abscess. Tenderness. A sore on the skin. How is this diagnosed? This condition may be  diagnosed based on: A physical exam. Your medical history. A sample of pus. This may be used to find out what is causing the infection. Blood tests. Imaging tests, such as an ultrasound, CT scan, or MRI. How is this treated? A small abscess that drains on its own may not need treatment. Treatment for larger abscesses may include: Moist heat or heat pack applied to the area several times a day. A procedure to drain the abscess (incision and drainage). Antibiotic medicines. For a severe abscess, you may first get antibiotics through an IV and then change to antibiotics by mouth. Follow these instructions at home: Medicines  Take over-the-counter and prescription medicines only as told by your health care provider. If you were prescribed an antibiotic medicine, take it as told by your health care provider. Do not stop taking the antibiotic even if you start to feel better. Abscess care  If you have an abscess that has not drained, apply heat to the affected area. Use the heat source that your health care provider recommends, such as a moist heat pack or a heating pad. Place a towel between your skin and the heat source. Leave the heat on for 20-30 minutes. Remove the heat if your skin turns bright red. This is especially important if you are unable to feel pain, heat, or cold. You may have a greater risk of getting burned. Follow instructions from your health care provider about how to take care of your abscess. Make  sure you: Cover the abscess with a bandage (dressing). Change your dressing or gauze as told by your health care provider. Wash your hands with soap and water before you change the dressing or gauze. If soap and water are not available, use hand sanitizer. Check your abscess every day for signs of a worsening infection. Check for: More redness, swelling, or pain. More fluid or blood. Warmth. More pus or a bad smell. General instructions To avoid spreading the infection: Do  not share personal care items, towels, or hot tubs with others. Avoid making skin contact with other people. Keep all follow-up visits as told by your health care provider. This is important. Contact a health care provider if you have: More redness, swelling, or pain around your abscess. More fluid or blood coming from your abscess. Warm skin around your abscess. More pus or a bad smell coming from your abscess. Muscle aches. Chills or a general ill feeling. Get help right away if you: Have severe pain. See red streaks on your skin spreading away from the abscess. See redness that spreads quickly. Have a fever or chills. Summary A skin abscess is an infected area on or under your skin that contains a collection of pus and other material. A small abscess that drains on its own may not need treatment. Treatment for larger abscesses may include having a procedure to drain the abscess and taking an antibiotic. This information is not intended to replace advice given to you by your health care provider. Make sure you discuss any questions you have with your health care provider. Document Revised: 02/24/2022 Document Reviewed: 08/30/2021 Elsevier Patient Education  [place].

## 2022-10-21 ENCOUNTER — Ambulatory Visit: Payer: Medicare Other | Admitting: Family Medicine

## 2022-11-02 ENCOUNTER — Ambulatory Visit (LOCAL_COMMUNITY_HEALTH_CENTER): Payer: Medicare Other

## 2022-11-02 DIAGNOSIS — Z23 Encounter for immunization: Secondary | ICD-10-CM | POA: Diagnosis not present

## 2022-11-02 DIAGNOSIS — Z719 Counseling, unspecified: Secondary | ICD-10-CM

## 2022-11-02 NOTE — Progress Notes (Signed)
Client seen in nurse clinic for COVID-19 and Flu vaccine.  VIS given for both   Covid-19 screening  Are you feeling sick today? No   Have you ever received a dose of COVID-19 Vaccine? [employer], [place], [place], [place], Other) Yes  If yes, which vaccine and how many doses?     [place] 4  Did you bring the vaccination record card or other documentation?  Yes   Do you have a health condition or are undergoing treatment that makes you moderately or severely immunocompromised? This would include, but not be limited to: cancer, HIV, organ transplant, immunosuppressive therapy/high-dose corticosteroids, or moderate/severe primary immunodeficiency.  No  Have you received COVID-19 vaccine before or during hematopoietic cell transplant (HCT) or CAR-T-cell therapies? No  Have you ever had an allergic reaction to: (This would include a severe allergic reaction or a reaction that caused hives, swelling, or respiratory distress, including wheezing.) A component of a COVID-19 vaccine or a previous dose of COVID-19 vaccine? No   Have you ever had an allergic reaction to another vaccine (other thanCOVID-19 vaccine) or an injectable medication? (This would include a severe allergic reaction or a reaction that caused hives, swelling, or respiratory distress, including wheezing.)   No    Do you have a history of any of the following:  Myocarditis or Pericarditis No  Dermal fillers:  No  Multisystem Inflammatory Syndrome (MIS-C or MIS-A)? No  COVID-19 disease within the past 3 months? No  Vaccinated with monkeypox vaccine in the last 4 weeks? No    Vaccine administered, and tolerated well, client stayed 15 minutes for observation. After vaccine care reviewed.  Copy of [facility] provided.   Yuepheng Schaller Shelda Pal, RN

## 2023-02-22 ENCOUNTER — Telehealth: Payer: Self-pay | Admitting: Family Medicine

## 2023-02-22 NOTE — Telephone Encounter (Signed)
Contacted [name] to schedule their annual wellness visit. Appointment made for 03/31/2023.  [facility] Direct Dial: [phone]

## 2023-02-24 ENCOUNTER — Other Ambulatory Visit: Payer: Self-pay | Admitting: Family Medicine

## 2023-02-24 DIAGNOSIS — I10 Essential (primary) hypertension: Secondary | ICD-10-CM

## 2023-02-27 NOTE — Telephone Encounter (Signed)
Pt needs appt

## 2023-03-14 ENCOUNTER — Telehealth: Payer: Self-pay | Admitting: Family Medicine

## 2023-03-14 NOTE — Telephone Encounter (Signed)
Contacted Oakley C Pullen to schedule their annual wellness visit. Appointment made for 03/31/2023.  Bernice Cicero Care Guide [facility] TE[facility] Direct Dial: [phone]   

## 2023-03-31 ENCOUNTER — Ambulatory Visit (INDEPENDENT_AMBULATORY_CARE_PROVIDER_SITE_OTHER): Payer: Medicare Other

## 2023-03-31 VITALS — Ht 65.5 in | Wt 222.8 lb

## 2023-03-31 DIAGNOSIS — Z0000 Encounter for general adult medical examination without abnormal findings: Secondary | ICD-10-CM | POA: Diagnosis not present

## 2023-03-31 NOTE — Patient Instructions (Signed)
Laura Kent , Thank you for taking time to come for your Medicare Wellness Visit. I appreciate your ongoing commitment to your health goals. Please review the following plan we discussed and let me know if I can assist you in the future.   These are the goals we discussed:  Goals      Increase physical activity     Recommend increasing physical activity to at least 3 days per week      Patient Stated     03/31/2023, wants to be more active     Quit Smoking     If you wish to quit smoking, help is available. For free tobacco cessation program offerings call the [facility] at (403)682-3810 or Live Well Line at 854-168-7888. You may also visit [email or web address] or email livelifewell@[email or web address] [email or web address] for more information on other programs.          This is a list of the screening recommended for you and due dates:  Health Maintenance  Topic Date Due   Zoster (Shingles) Vaccine (1 of 2) Never done   Mammogram  04/14/2023   Flu Shot  07/06/2023   Medicare Annual Wellness Visit  03/30/2024   DEXA scan (bone density measurement)  04/13/2024   Colon Cancer Screening  01/17/2027   DTaP/Tdap/Td vaccine (3 - Td or Tdap) 02/24/2030   Pneumonia Vaccine  Completed   COVID-19 Vaccine  Completed   Hepatitis C Screening: USPSTF Recommendation to screen - Ages 73-79 yo.  Completed   HPV Vaccine  Aged Out    Advanced directives: Advance directive discussed with you today.   Conditions/risks identified: none  Next appointment: Follow up in one year for your annual wellness visit    Preventive Care 65 Years and Older, Female Preventive care refers to lifestyle choices and visits with your health care provider that can promote health and wellness. What does preventive care include? A yearly physical exam. This is also called an annual well check. Dental exams once or twice a year. Routine eye exams. Ask your health care provider how often you should have your eyes  checked. Personal lifestyle choices, including: Daily care of your teeth and gums. Regular physical activity. Eating a healthy diet. Avoiding tobacco and drug use. Limiting alcohol use. Practicing safe sex. Taking low-dose aspirin every day. Taking vitamin and mineral supplements as recommended by your health care provider. What happens during an annual well check? The services and screenings done by your health care provider during your annual well check will depend on your age, overall health, lifestyle risk factors, and family history of disease. Counseling  Your health care provider may ask you questions about your: Alcohol use. Tobacco use. Drug use. Emotional well-being. Home and relationship well-being. Sexual activity. Eating habits. History of falls. Memory and ability to understand (cognition). Work and work Astronomer. Reproductive health. Screening  You may have the following tests or measurements: Height, weight, and BMI. Blood pressure. Lipid and cholesterol levels. These may be checked every 5 years, or more frequently if you are over 51 years old. Skin check. Lung cancer screening. You may have this screening every year starting at age 9 if you have a 30-pack-year history of smoking and currently smoke or have quit within the past 15 years. Fecal occult blood test (FOBT) of the stool. You may have this test every year starting at age 51. Flexible sigmoidoscopy or colonoscopy. You may have a sigmoidoscopy every 5 years or a colonoscopy every 10 years  starting at age 27. Hepatitis C blood test. Hepatitis B blood test. Sexually transmitted disease (STD) testing. Diabetes screening. This is done by checking your blood sugar (glucose) after you have not eaten for a while (fasting). You may have this done every 1-3 years. Bone density scan. This is done to screen for osteoporosis. You may have this done starting at age 5. Mammogram. This may be done every 1-2  years. Talk to your health care provider about how often you should have regular mammograms. Talk with your health care provider about your test results, treatment options, and if necessary, the need for more tests. Vaccines  Your health care provider may recommend certain vaccines, such as: Influenza vaccine. This is recommended every year. Tetanus, diphtheria, and acellular pertussis (Tdap, Td) vaccine. You may need a Td booster every 10 years. Zoster vaccine. You may need this after age 48. Pneumococcal 13-valent conjugate (PCV13) vaccine. One dose is recommended after age 62. Pneumococcal polysaccharide (PPSV23) vaccine. One dose is recommended after age 60. Talk to your health care provider about which screenings and vaccines you need and how often you need them. This information is not intended to replace advice given to you by your health care provider. Make sure you discuss any questions you have with your health care provider. Document Released: 12/18/2015 Document Revised: 08/10/2016 Document Reviewed: 09/22/2015 Elsevier Interactive Patient Education  2017 [place] Prevention in the Home Falls can cause injuries. They can happen to people of all ages. There are many things you can do to make your home safe and to help prevent falls. What can I do on the outside of my home? Regularly fix the edges of walkways and driveways and fix any cracks. Remove anything that might make you trip as you walk through a door, such as a raised step or threshold. Trim any bushes or trees on the path to your home. Use bright outdoor lighting. Clear any walking paths of anything that might make someone trip, such as rocks or tools. Regularly check to see if handrails are loose or broken. Make sure that both sides of any steps have handrails. Any raised decks and porches should have guardrails on the edges. Have any leaves, snow, or ice cleared regularly. Use sand or salt on walking paths  during winter. Clean up any spills in your garage right away. This includes oil or grease spills. What can I do in the bathroom? Use night lights. Install grab bars by the toilet and in the tub and shower. Do not use towel bars as grab bars. Use non-skid mats or decals in the tub or shower. If you need to sit down in the shower, use a plastic, non-slip stool. Keep the floor dry. Clean up any water that spills on the floor as soon as it happens. Remove soap buildup in the tub or shower regularly. Attach bath mats securely with double-sided non-slip rug tape. Do not have throw rugs and other things on the floor that can make you trip. What can I do in the bedroom? Use night lights. Make sure that you have a light by your bed that is easy to reach. Do not use any sheets or blankets that are too big for your bed. They should not hang down onto the floor. Have a firm chair that has side arms. You can use this for support while you get dressed. Do not have throw rugs and other things on the floor that can make you trip. What  can I do in the kitchen? Clean up any spills right away. Avoid walking on wet floors. Keep items that you use a lot in easy-to-reach places. If you need to reach something above you, use a strong step stool that has a grab bar. Keep electrical cords out of the way. Do not use floor polish or wax that makes floors slippery. If you must use wax, use non-skid floor wax. Do not have throw rugs and other things on the floor that can make you trip. What can I do with my stairs? Do not leave any items on the stairs. Make sure that there are handrails on both sides of the stairs and use them. Fix handrails that are broken or loose. Make sure that handrails are as long as the stairways. Check any carpeting to make sure that it is firmly attached to the stairs. Fix any carpet that is loose or worn. Avoid having throw rugs at the top or bottom of the stairs. If you do have throw  rugs, attach them to the floor with carpet tape. Make sure that you have a light switch at the top of the stairs and the bottom of the stairs. If you do not have them, ask someone to add them for you. What else can I do to help prevent falls? Wear shoes that: Do not have high heels. Have rubber bottoms. Are comfortable and fit you well. Are closed at the toe. Do not wear sandals. If you use a stepladder: Make sure that it is fully opened. Do not climb a closed stepladder. Make sure that both sides of the stepladder are locked into place. Ask someone to hold it for you, if possible. Clearly mark and make sure that you can see: Any grab bars or handrails. First and last steps. Where the edge of each step is. Use tools that help you move around (mobility aids) if they are needed. These include: Canes. Walkers. Scooters. Crutches. Turn on the lights when you go into a dark area. Replace any light bulbs as soon as they burn out. Set up your furniture so you have a clear path. Avoid moving your furniture around. If any of your floors are uneven, fix them. If there are any pets around you, be aware of where they are. Review your medicines with your doctor. Some medicines can make you feel dizzy. This can increase your chance of falling. Ask your doctor what other things that you can do to help prevent falls. This information is not intended to replace advice given to you by your health care provider. Make sure you discuss any questions you have with your health care provider. Document Released: 09/17/2009 Document Revised: 04/28/2016 Document Reviewed: 12/26/2014 Elsevier Interactive Patient Education  2017 [employer].

## 2023-03-31 NOTE — Progress Notes (Signed)
I connected with  Blima Dessert on 03/31/23 by a audio enabled telemedicine application and verified that I am speaking with the correct person using two identifiers.  Patient Location: Home  Provider Location: Office/Clinic  I discussed the limitations of evaluation and management by telemedicine. The patient expressed understanding and agreed to proceed.  Subjective:   Laura Kent is a 72 y.o. female who presents for Medicare Annual (Subsequent) preventive examination.  Patient Medicare AWV questionnaire was completed by the patient on 03/28/2023; I have confirmed that all information answered by patient is correct and no changes since this date.     Review of Systems     Cardiac Risk Factors include: advanced age (>56men, >109 women);hypertension;obesity (BMI >30kg/m2)     Objective:    Today's Vitals   03/31/23 1427  Weight: 222 lb 12.8 oz (101.1 kg)  Height: 5' 5.5" (1.664 m)   Body mass index is 36.51 kg/m.     03/31/2023    2:31 PM 03/11/2021    3:03 PM 04/24/2017    9:05 AM 01/17/2017    1:13 PM 11/21/2016    8:43 AM 05/25/2016    4:16 PM  Advanced Directives  Does Patient Have a Medical Advance Directive? No No No No No No  Would patient like information on creating a medical advance directive?  Yes (MAU/Ambulatory/Procedural Areas - Information given)    No - patient declined information    Current Medications (verified) Outpatient Encounter Medications as of 03/31/2023  Medication Sig   amLODipine (NORVASC) 5 MG tablet TAKE 1 TABLET BY MOUTH DAILY   aspirin EC 81 MG tablet Take 81 mg by mouth daily.   cholecalciferol (VITAMIN D) 1000 units tablet Take 1,000 Units by mouth daily as needed.   Turmeric 500 MG CAPS Take by mouth.   valsartan-hydrochlorothiazide (DIOVAN-HCT) 160-25 MG tablet TAKE 1 TABLET BY MOUTH DAILY   rosuvastatin (CRESTOR) 5 MG tablet Take 1 tablet (5 mg total) by mouth at bedtime. (Patient not taking: Reported on 03/31/2023)   Zoster  Vaccine Adjuvanted [facility]) injection Inject 0.5 mLs into the muscle once for 1 dose. And repeat once more in 2-6 months   No facility-administered encounter medications on file as of 03/31/2023.    Allergies (verified) Patient has no known allergies.   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   Obesity    Plantar fascial fibromatosis of left foot    Plantar fasciitis 04/21/2017   Reflux    Past Surgical History:  Procedure Laterality Date   BREAST CYST ASPIRATION Right 2011   BUNIONECTOMY     CHOLECYSTECTOMY  09/2013   COLONOSCOPY WITH PROPOFOL N/A 01/17/2017   Procedure: COLONOSCOPY WITH PROPOFOL;  Surgeon: Earline Mayotte, MD;  Location: [facility] ENDOSCOPY;  Service: Endoscopy;  Laterality: N/A;   EYE SURGERY  Feb 2023   Cataracts   POSTERIOR TIBIAL TENDON REPAIR Left 10/26/2018   Family History  Problem Relation Age of Onset   Diabetes Mother    Kidney disease Mother    Heart disease Mother    Hypertension Mother    Diabetes Father    Heart disease Father    Hypertension Father    Cancer Brother  leukemia   Heart disease Brother        tachycardia   Stroke Brother        fatal   Diabetes Sister    Colon cancer Maternal Aunt    Diabetes Maternal Grandmother    Diabetes Brother    Stroke Brother    Heart disease Brother    Heart disease Brother    Breast cancer Neg Hx    Social History   Socioeconomic History   Marital status: Married    Spouse name: Not on file   Number of children: Not on file   Years of education: Not on file   Highest education level: Not on file  Occupational History   Occupation: retired  Tobacco Use   Smoking status: Every Day    Packs/day: 0.50    Years: 34.00     Additional pack years: 0.00    Total pack years: 17.00    Types: Cigarettes    Last attempt to quit: 05/18/2012    Years since quitting: 10.8   Smokeless tobacco: Never   Tobacco comments:    Quit 2013 started back 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  Vaping Use   Vaping Use: Never used  Substance and Sexual Activity   Alcohol use: No   Drug use: No   Sexual activity: Not Currently    Birth control/protection: None  Other Topics Concern   Not on file  Social History Narrative   Not on file   Social Determinants of Health   Financial Resource Strain: Low Risk  (03/28/2023)   Overall Financial Resource Strain (CARDIA)    Difficulty of Paying Living Expenses: Not hard at all  Food Insecurity: No Food Insecurity (03/28/2023)   Hunger Vital Sign    Worried About Running Out of Food in the Last Year: Never true    Ran Out of Food in the Last Year: Never true  Transportation Needs: No Transportation Needs (03/28/2023)   PRAPARE - Administrator, Civil Service (Medical): No    Lack of Transportation (Non-Medical): No  Physical Activity: Insufficiently Active (03/28/2023)   Exercise Vital Sign    Days of Exercise per Week: 1 day    Minutes of Exercise per Session: 10 min  Stress: No Stress Concern Present (03/28/2023)   [employer] of Occupational Health - Occupational Stress Questionnaire    Feeling of Stress : Only a little  Social Connections: Unknown (03/28/2023)   Social Connection and Isolation Panel [NHANES]    Frequency of Communication with Friends and Family: More than three times a week    Frequency of Social Gatherings with Friends and Family: Once a week    Attends Religious Services: Not on Marketing executive or Organizations: No    Attends Banker Meetings: Never    Marital Status: Married    Tobacco Counseling Ready to quit: Yes Counseling given: Not Answered Tobacco comments: Quit 2013 started back 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   Clinical  Intake:  Pre-visit preparation completed: Yes  Pain : No/denies pain     Nutritional Status: BMI > 30  Obese Nutritional Risks: None Diabetes: No  How often do you need to have someone help you when you read instructions, pamphlets, or other written materials from your doctor or pharmacy?: 1 - Never  Diabetic? no  Interpreter Needed?: No  Information entered by :: NAllen LPN   Activities of Daily Living    03/28/2023  9:40 AM 09/22/2022    9:24 AM  In your present state of health, do you have any difficulty performing the following activities:  Hearing? 0 0  Vision? 0 0  Difficulty concentrating or making decisions? 0 0  Walking or climbing stairs? 0 0  Dressing or bathing? 0 0  Doing errands, shopping? 0 0  Preparing Food and eating ? N   Using the Toilet? N   In the past six months, have you accidently leaked urine? N   Do you have problems with loss of bowel control? N   Managing your Medications? N   Managing your Finances? N   Housekeeping or managing your Housekeeping? N     Patient Care Team: Danelle Berry, PA-C as PCP - General (Family Medicine)  Indicate any recent Medical Services you may have received from other than [facility] providers in the past year (date may be approximate).     Assessment:   This is a routine wellness examination for Marni.  Hearing/Vision screen Vision Screening - Comments:: Regular eye exams, [facility]  Dietary issues and exercise activities discussed: Current Exercise Habits: The patient does not participate in regular exercise at present   Goals Addressed             This Visit's Progress    Patient Stated       03/31/2023, wants to be more active       Depression Screen    03/31/2023    2:31 PM 09/22/2022    9:24 AM 04/21/2022    8:29 AM 03/22/2022    3:11 PM 03/02/2022    1:31 PM 10/21/2021    9:39 AM 04/19/2021   10:47 AM  PHQ 2/9 Scores  PHQ - 2 Score 0 0 0 0 0 0 0  PHQ- 9 Score  0 0  0 0 0     Fall Risk    03/28/2023    9:40 AM 09/22/2022    9:24 AM 04/21/2022    8:29 AM 03/22/2022    3:13 PM 03/02/2022    1:30 PM  Fall Risk   Falls in the past year? 0 0 0 0 0  Number falls in past yr: 0 0 0 0 0  Injury with Fall? 0 0 0 0 0  Risk for fall due to : Medication side effect No Fall Risks No Fall Risks No Fall Risks No Fall Risks  Follow up Falls prevention discussed;Education provided;Falls evaluation completed Falls prevention discussed;Education provided Falls prevention discussed Falls prevention discussed Falls prevention discussed    FALL RISK PREVENTION PERTAINING TO THE HOME:  Any stairs in or around the home? Yes  If so, are there any without handrails? Yes  Home free of loose throw rugs in walkways, pet beds, electrical cords, etc? Yes  Adequate lighting in your home to reduce risk of falls? Yes   ASSISTIVE DEVICES UTILIZED TO PREVENT FALLS:  Life alert? No  Use of a cane, walker or w/c? No  Grab bars in the bathroom? Yes  Shower chair or bench in shower? No  Elevated toilet seat or a handicapped toilet? Yes   TIMED UP AND GO:  Was the test performed? No .      Cognitive Function:        03/31/2023    2:32 PM  6CIT Screen  What Year? 0 points  What month? 0 points  What time? 0 points  Count back from 20 0 points  Months in reverse 0  points  Repeat phrase 0 points  Total Score 0 points    Immunizations Immunization History  Administered Date(s) Administered   COVID-19, mRNA, vaccine(Comirnaty)12 years and older 11/02/2022   Fluad Quad(high Dose 65+) 11/04/2020   Influenza, High Dose Seasonal PF 09/24/2018, 10/04/2021, 11/02/2022   Influenza,inj,Quad PF,6+ Mos 10/04/2019   Influenza-Unspecified 08/12/2014, 08/06/2015, 09/24/2018   PFIZER(Purple Top)SARS-COV-2 Vaccination 01/15/2020, 02/05/2020, 09/29/2020   Pfizer Covid-19 Vaccine Bivalent Booster 67yrs & up 10/04/2021   Pneumococcal Conjugate-13 10/16/2018   Pneumococcal Polysaccharide-23  08/24/2012, 02/25/2020   Td 08/05/2009   Tdap 02/25/2020   Zoster, Live 05/19/2015    TDAP status: Up to date  Flu Vaccine status: Up to date  Pneumococcal vaccine status: Up to date  Covid-19 vaccine status: Completed vaccines  Qualifies for Shingles Vaccine? Yes   Zostavax completed Yes   Shingrix Completed?: Yes  Screening Tests Health Maintenance  Topic Date Due   Zoster Vaccines- Shingrix (1 of 2) Never done   Medicare Annual Wellness (AWV)  03/23/2023   MAMMOGRAM  04/14/2023   INFLUENZA VACCINE  07/06/2023   DEXA SCAN  04/13/2024   COLONOSCOPY (Pts 45-45yrs Insurance coverage will need to be confirmed)  01/17/2027   DTaP/Tdap/Td (3 - Td or Tdap) 02/24/2030   Pneumonia Vaccine 106+ Years old  Completed   COVID-19 Vaccine  Completed   Hepatitis C Screening  Completed   HPV VACCINES  Aged Out    Health Maintenance  Health Maintenance Due  Topic Date Due   Zoster Vaccines- Shingrix (1 of 2) Never done   Medicare Annual Wellness (AWV)  03/23/2023    Colorectal cancer screening: Type of screening: Colonoscopy. Completed 01/17/2017. Repeat every 10 years  Mammogram status: Completed 04/13/2022. Repeat every year  Bone Density status: Completed 04/13/2022.   Lung Cancer Screening: (Low Dose CT Chest recommended if Age 101-80 years, 30 pack-year currently smoking OR have quit w/in 15years.) does not qualify.   Lung Cancer Screening Referral: no  Additional Screening:  Hepatitis C Screening: does qualify; Completed 11/16/2015  Vision Screening: Recommended annual ophthalmology exams for early detection of glaucoma and other disorders of the eye. Is the patient up to date with their annual eye exam?  Yes  Who is the provider or what is the name of the office in which the patient attends annual eye exams? [facility] If pt is not established with a provider, would they like to be referred to a provider to establish care? No .   Dental Screening: Recommended  annual dental exams for proper oral hygiene  Community Resource Referral / Chronic Care Management: CRR required this visit?  No   CCM required this visit?  No      Plan:     I have personally reviewed and noted the following in the patient's chart:   Medical and social history Use of alcohol, tobacco or illicit drugs  Current medications and supplements including opioid prescriptions. Patient is not currently taking opioid prescriptions. Functional ability and status Nutritional status Physical activity Advanced directives List of other physicians Hospitalizations, surgeries, and ER visits in previous 12 months Vitals Screenings to include cognitive, depression, and falls Referrals and appointments  In addition, I have reviewed and discussed with patient certain preventive protocols, quality metrics, and best practice recommendations. A written personalized care plan for preventive services as well as general preventive health recommendations were provided to patient.     Barb Merino, LPN   1/61/0960   Nurse Notes: none  Due  to this being a virtual visit, the after visit summary with patients personalized plan was offered to patient via mail or my-chart. Patient would like to access on my-chart

## 2023-04-25 ENCOUNTER — Other Ambulatory Visit: Payer: Self-pay | Admitting: Family Medicine

## 2023-04-25 DIAGNOSIS — I10 Essential (primary) hypertension: Secondary | ICD-10-CM

## 2023-04-26 NOTE — Telephone Encounter (Signed)
Pt needs appt

## 2023-05-03 ENCOUNTER — Ambulatory Visit (INDEPENDENT_AMBULATORY_CARE_PROVIDER_SITE_OTHER): Payer: Medicare Other | Admitting: Family Medicine

## 2023-05-03 ENCOUNTER — Encounter: Payer: Self-pay | Admitting: Family Medicine

## 2023-05-03 VITALS — BP 114/70 | HR 82 | Temp 97.8°F | Resp 16 | Ht 65.0 in | Wt 222.3 lb

## 2023-05-03 DIAGNOSIS — Z9189 Other specified personal risk factors, not elsewhere classified: Secondary | ICD-10-CM

## 2023-05-03 DIAGNOSIS — R7303 Prediabetes: Secondary | ICD-10-CM

## 2023-05-03 DIAGNOSIS — I10 Essential (primary) hypertension: Secondary | ICD-10-CM

## 2023-05-03 DIAGNOSIS — R2232 Localized swelling, mass and lump, left upper limb: Secondary | ICD-10-CM

## 2023-05-03 DIAGNOSIS — Z5181 Encounter for therapeutic drug level monitoring: Secondary | ICD-10-CM | POA: Diagnosis not present

## 2023-05-03 DIAGNOSIS — Z720 Tobacco use: Secondary | ICD-10-CM

## 2023-05-03 DIAGNOSIS — Z716 Tobacco abuse counseling: Secondary | ICD-10-CM

## 2023-05-03 DIAGNOSIS — Z1231 Encounter for screening mammogram for malignant neoplasm of breast: Secondary | ICD-10-CM

## 2023-05-03 DIAGNOSIS — E0590 Thyrotoxicosis, unspecified without thyrotoxic crisis or storm: Secondary | ICD-10-CM

## 2023-05-03 DIAGNOSIS — Z6837 Body mass index (BMI) 37.0-37.9, adult: Secondary | ICD-10-CM

## 2023-05-03 LAB — CBC WITH DIFFERENTIAL/PLATELET
Basophils Absolute: 29 cells/uL (ref 0–200)
Basophils Relative: 0.6 %
HCT: 44.2 % (ref 35.0–45.0)
Monocytes Relative: 8 %
Neutro Abs: 2563 cells/uL (ref 1500–7800)

## 2023-05-03 MED ORDER — BUPROPION HCL ER (SR) 150 MG PO TB12
150.0000 mg | ORAL_TABLET | Freq: Two times a day (BID) | ORAL | 1 refills | Status: DC
Start: 2023-05-03 — End: 2023-11-06

## 2023-05-03 NOTE — Patient Instructions (Addendum)
Here is some information about crestor/statins and why they have been recommended for you and also see the handout in how they work.  The main goal is to reduce the risk and incidence of heart attacks and strokes.  The statins provide that benefit even if you start with excellent cholesterol numbers like you have.   ASCVD - Atherosclerotic cardiovascular disease risk calculation is 15.0-14.3% Risk of cardiovascular event (coronary or stroke death or non-fatal MI or stroke) in next 10 years.  Moderate to high-intensity statin recommended because 10-year risk >7.5%  You can also lower risk by stopping smoking.  Let us know if you would like resources or get an appointment to discuss medications  [facility] [address], [place], [place] [address] Scheduling phone #: [phone]  Health Maintenance  Topic Date Due   Mammogram  04/14/2023   Zoster (Shingles) Vaccine (1 of 2) 08/03/2023*   Flu Shot  07/06/2023   Medicare Annual Wellness Visit  03/30/2024   DEXA scan (bone density measurement)  04/13/2024   Colon Cancer Screening  01/17/2027   DTaP/Tdap/Td vaccine (3 - Td or Tdap) 02/24/2030   Pneumonia Vaccine  Completed   COVID-19 Vaccine  Completed   Hepatitis C Screening  Completed   HPV Vaccine  Aged Out  *Topic was postponed. The date shown is not the original due date.

## 2023-05-03 NOTE — Progress Notes (Signed)
Name: Laura Kent   MRN: [id number]    DOB: 10-16-1951   Date:05/03/2023       Progress Note  Chief Complaint  Patient presents with   Follow-up   Hypertension   Hypothyroidism   Hyperlipidemia     Subjective:   Laura Kent is a 72 y.o. female, presents to clinic for routine f/up  Hypertension:  Currently managed on norvasc, valsartan-HCTZ 160-25 Pt reports good med compliance and denies any SE.   Blood pressure today is well controlled. BP Readings from Last 3 Encounters:  05/03/23 114/70  09/22/22 136/74  04/21/22 124/66   Pt denies CP, SOB, exertional sx, LE edema, palpitation, Ha's, visual disturbances, lightheadedness, hypotension, syncope.   Hyperlipidemia: Prescribed crestor but pt is not taking it-she never picked it up Last Lipids: Lab Results  Component Value Date   CHOL 116 04/21/2022   HDL 52 04/21/2022   LDLCALC 50 04/21/2022   TRIG 63 04/21/2022   CHOLHDL 2.2 04/21/2022  - Denies: Chest pain, shortness of breath, myalgias, claudication  Hx of prediabetes - last two years A1C has been 6.1  Left wrist lesion, firm nodule -it has been there raised with a round shape firm and the skin slightly hyperpigmented  Smoking 10-15 cig/day Chantix -in the past caused her very bothersome dreams but she is open to trying other medications Smoking cessation instruction/counseling given:  counseled patient on the dangers of tobacco use, advised patient to stop smoking, and reviewed strategies to maximize success       Current Outpatient Medications:    amLODipine (NORVASC) 5 MG tablet, TAKE 1 TABLET BY MOUTH DAILY, Disp: 90 tablet, Rfl: 0   aspirin EC 81 MG tablet, Take 81 mg by mouth daily., Disp: , Rfl:    cholecalciferol (VITAMIN D) 1000 units tablet, Take 1,000 Units by mouth daily as needed., Disp: , Rfl:    Turmeric 500 MG CAPS, Take by mouth., Disp: , Rfl:    valsartan-hydrochlorothiazide (DIOVAN-HCT) 160-25 MG tablet, TAKE 1 TABLET BY MOUTH  DAILY, Disp: 90 tablet, Rfl: 0   rosuvastatin (CRESTOR) 5 MG tablet, Take 1 tablet (5 mg total) by mouth at bedtime. (Patient not taking: Reported on 05/03/2023), Disp: 90 tablet, Rfl: 3   Zoster Vaccine Adjuvanted [facility]) injection, Inject 0.5 mLs into the muscle once for 1 dose. And repeat once more in 2-6 months (Patient not taking: Reported on 05/03/2023), Disp: 0.5 mL, Rfl: 1  Patient Active Problem List   Diagnosis Date Noted   Prediabetes 08/19/2019   Tobacco abuse 11/24/2017   Hyperthyroidism 04/30/2017   Benign hypertension 05/19/2015   Class 2 severe obesity with serious comorbidity and body mass index (BMI) of 37.0 to 37.9 in adult [facility]) 05/19/2015    Past Surgical History:  Procedure Laterality Date   BREAST CYST ASPIRATION Right 2011   BUNIONECTOMY     CHOLECYSTECTOMY  09/2013   COLONOSCOPY WITH PROPOFOL N/A 01/17/2017   Procedure: COLONOSCOPY WITH PROPOFOL;  Surgeon: Earline Mayotte, MD;  Location: [facility] ENDOSCOPY;  Service: Endoscopy;  Laterality: N/A;   EYE SURGERY  Feb 2023   Cataracts   POSTERIOR TIBIAL TENDON REPAIR Left 10/26/2018    Family History  Problem Relation Age of Onset   Diabetes Mother    Kidney disease Mother    Heart disease Mother    Hypertension Mother    Diabetes Father    Heart disease Father    Hypertension Father    Cancer Brother  leukemia   Heart disease Brother        tachycardia   Stroke Brother        fatal   Diabetes Sister    Colon cancer Maternal Aunt    Diabetes Maternal Grandmother    Diabetes Brother    Stroke Brother    Heart disease Brother    Heart disease Brother    Breast cancer Neg Hx     Social History   Tobacco Use   Smoking status: Every Day    Packs/day: 0.50    Years: 34.00    Additional pack years: 0.00    Total pack years: 17.00    Types: Cigarettes    Last attempt to quit: 05/18/2012    Years since quitting: 10.9   Smokeless tobacco: Never   Tobacco comments:    Quit 2013 started back  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  Vaping Use   Vaping Use: Never used  Substance Use Topics   Alcohol use: No   Drug use: No     No Known Allergies  Health Maintenance  Topic Date Due   MAMMOGRAM  04/14/2023   Zoster Vaccines- Shingrix (1 of 2) 08/03/2023 (Originally 11/01/2002)   INFLUENZA VACCINE  07/06/2023   Medicare Annual Wellness (AWV)  03/30/2024   DEXA SCAN  04/13/2024   Colonoscopy  01/17/2027   DTaP/Tdap/Td (3 - Td or Tdap) 02/24/2030   Pneumonia Vaccine 20+ Years old  Completed   COVID-19 Vaccine  Completed   Hepatitis C Screening  Completed   HPV VACCINES  Aged Out    Chart Review Today: I personally reviewed active problem list, medication list, allergies, family history, social history, health maintenance, notes from last encounter, lab results, imaging with the patient/caregiver today.   Review of Systems  Constitutional: Negative.   HENT: Negative.    Eyes: Negative.   Respiratory: Negative.    Cardiovascular: Negative.   Gastrointestinal: Negative.   Endocrine: Negative.   Genitourinary: Negative.   Musculoskeletal: Negative.   Skin: Negative.   Allergic/Immunologic: Negative.   Neurological: Negative.   Hematological: Negative.   Psychiatric/Behavioral: Negative.    All other systems reviewed and are negative.    Objective:   Vitals:   05/03/23 0855  BP: 114/70  Pulse: 82  Resp: 16  Temp: 97.8 F (36.6 C)  TempSrc: Oral  SpO2: 99%  Weight: 222 lb 4.8 oz (100.8 kg)  Height: 5\' 5"  (1.651 m)    Body mass index is 36.99 kg/m.  Physical Exam Vitals and nursing note reviewed.  Constitutional:      General: She is not in acute distress.    Appearance: Normal appearance. She is well-developed. She is obese. She is not ill-appearing, toxic-appearing or diaphoretic.     Interventions: Face mask in place.  HENT:     Head: Normocephalic and atraumatic.     Right Ear: External ear normal.     Left Ear: External ear normal.     Nose: Nose normal.  Eyes:      General: Lids are normal. No scleral icterus.       Right eye: No discharge.        Left eye: No discharge.     Conjunctiva/sclera: Conjunctivae normal.  Neck:     Trachea: Phonation normal. No tracheal deviation.  Cardiovascular:     Rate and Rhythm: Normal rate and regular rhythm.     Pulses: Normal pulses.          Radial pulses are  2+ on the right side and 2+ on the left side.     Heart sounds: Normal heart sounds. No murmur heard.    No friction rub. No gallop.  Pulmonary:     Effort: Pulmonary effort is normal. No respiratory distress.     Breath sounds: Normal breath sounds. No stridor. No wheezing, rhonchi or rales.  Chest:     Chest wall: No tenderness.  Abdominal:     General: Bowel sounds are normal. There is no distension.     Palpations: Abdomen is soft.  Musculoskeletal:        General: Normal range of motion.     Right lower leg: No edema.     Left lower leg: No edema.     Comments: Left volar wrist firm round and raised nodule nonmobile and nonfluctuant with some hyperpigmentation of the skin roughly 1 cm in diameter  Skin:    General: Skin is warm and dry.     Coloration: Skin is not jaundiced or pale.     Findings: No rash.  Neurological:     Mental Status: She is alert.     Motor: No abnormal muscle tone.     Coordination: Coordination normal.     Gait: Gait normal.  Psychiatric:        Mood and Affect: Mood normal.        Speech: Speech normal.        Behavior: Behavior normal.         Assessment & Plan:   Problem List Items Addressed This Visit       Cardiovascular and Mediastinum   Benign hypertension - Primary (Chronic)    Currently managed on norvasc, valsartan-HCTZ 160-25 Pt reports good med compliance and denies any SE.   Blood pressure today is well controlled. BP Readings from Last 3 Encounters:  05/03/23 114/70  09/22/22 136/74  04/21/22 124/66  Continue meds, DASH and work on smoking cessation If bP continues to be well  controlled in the future we could consider cutting back on med doses or decreasing medications, currently she is not having any lightheaded episodes or low blood pressure readings      Relevant Orders   COMPLETE METABOLIC PANEL WITH GFR (Completed)     Endocrine   Hyperthyroidism    Dx many years ago with ablation treatment No concerning symptoms her labs have been normal for several years will continue to monitor  Denies the following symptoms: fatigue, weight gain, feeling cold and cold intolerance, constipation, swelling, feeling slow, losing hair, anxiousness, feeling excessive energy, tremulousness, palpitations, sweating, and weight loss  Labs ordered       Relevant Orders   COMPLETE METABOLIC PANEL WITH GFR (Completed)   TSH (Completed)     Other   Class 2 severe obesity with serious comorbidity and body mass index (BMI) of 37.0 to 37.9 in adult [facility])    With associated comorbidities of hypertension, hyperlipidemia severely uncontrolled, high ASCVD risk score      Relevant Orders   COMPLETE METABOLIC PANEL WITH GFR (Completed)   Hemoglobin A1c (Completed)   TSH (Completed)   CBC with Differential/Platelet (Completed)   VITAMIN D 25 Hydroxy (Vit-D Deficiency, Fractures) (Completed)   Lipid panel (Completed)   Tobacco abuse    Current 1 ppd smoker Does not want to quit right now Sometimes has a cough, no recurrent bronchitis, not currently doing CA screening program - not really interested Tried chantix in the past did not tolerate She  did agree to try Wellbutrin the dosing and side effects were reviewed with her encouraged her to start with 1 dose in the morning for several weeks before adding a second dose which I have recommended to do around lunchtime or noon and not later in the day because it may cause insomnia. Smoking cessation instruction/counseling given:  counseled patient on the dangers of tobacco use, advised patient to stop smoking, and reviewed strategies to  maximize success      Prediabetes    No particular diet efforts with a very strong family history Due for labs today Discussed prediabetes and development of type 2 diabetes which comes with a lot more medications and increased risks of heart disease and stroke which was reviewed with her and she was encouraged to work on diet and lifestyle efforts and if her A1c starts to increase I would recommend monitoring labs more frequently such as every 6 months Lab Results  Component Value Date        HGBA1C 6.1 (H) 04/21/2022   HGBA1C 6.1 (H) 04/19/2021   HGBA1C 6.3 (H) 02/17/2020   HGBA1C 6.1 (H) 08/19/2019   HGBA1C 6.2 (H) 10/16/2018        Relevant Orders   COMPLETE METABOLIC PANEL WITH GFR (Completed)   Hemoglobin A1c (Completed)   Other Visit Diagnoses     Medication monitoring encounter       Relevant Orders   COMPLETE METABOLIC PANEL WITH GFR (Completed)   Hemoglobin A1c (Completed)   TSH (Completed)   CBC with Differential/Platelet (Completed)   Encounter for screening mammogram for malignant neoplasm of breast       Relevant Orders   MM 3D SCREENING MAMMOGRAM BILATERAL BREAST   At high risk for cardiovascular disease       ASCVD score is very elevated with uncontrolled cholesterol hypertension and smoking, reviewed risk with her and medications   Relevant Orders   COMPLETE METABOLIC PANEL WITH GFR (Completed)   Hemoglobin A1c (Completed)   CBC with Differential/Platelet (Completed)   Lipid panel (Completed)   Encounter for smoking cessation counseling       Smoking cessation and medical options of meds discussed for more than 5 minutes today   Relevant Medications   buPROPion (WELLBUTRIN SR) 150 MG 12 hr tablet   Mass of left wrist       Nodule/mass for several years she is like someone to look at it or biopsy or or excision   Relevant Orders   Ambulatory referral to General Surgery        Return in about 6 months (around 11/03/2023) for HTN, med/routine f/up .    Danelle Berry, PA-C 05/03/23 9:22 AM

## 2023-05-04 LAB — CBC WITH DIFFERENTIAL/PLATELET
Absolute Monocytes: 384 cells/uL (ref 200–950)
Eosinophils Absolute: 91 cells/uL (ref 15–500)
Eosinophils Relative: 1.9 %
Hemoglobin: 14.3 g/dL (ref 11.7–15.5)
Lymphs Abs: 1733 cells/uL (ref 850–3900)
MCH: 29.1 pg (ref 27.0–33.0)
MCHC: 32.4 g/dL (ref 32.0–36.0)
MCV: 90 fL (ref 80.0–100.0)
MPV: 10.4 fL (ref 7.5–12.5)
Neutrophils Relative %: 53.4 %
Platelets: 266 10*3/uL (ref 140–400)
RBC: 4.91 10*6/uL (ref 3.80–5.10)
RDW: 12.9 % (ref 11.0–15.0)
Total Lymphocyte: 36.1 %
WBC: 4.8 10*3/uL (ref 3.8–10.8)

## 2023-05-04 LAB — COMPLETE METABOLIC PANEL WITH GFR
AG Ratio: 1.7 (calc) (ref 1.0–2.5)
ALT: 11 U/L (ref 6–29)
AST: 13 U/L (ref 10–35)
Albumin: 4.2 g/dL (ref 3.6–5.1)
Alkaline phosphatase (APISO): 70 U/L (ref 37–153)
BUN: 14 mg/dL (ref 7–25)
CO2: 30 mmol/L (ref 20–32)
Calcium: 10.3 mg/dL (ref 8.6–10.4)
Chloride: 104 mmol/L (ref 98–110)
Creat: 0.71 mg/dL (ref 0.60–1.00)
Globulin: 2.5 g/dL (calc) (ref 1.9–3.7)
Glucose, Bld: 95 mg/dL (ref 65–99)
Potassium: 4 mmol/L (ref 3.5–5.3)
Sodium: 142 mmol/L (ref 135–146)
Total Bilirubin: 0.5 mg/dL (ref 0.2–1.2)
Total Protein: 6.7 g/dL (ref 6.1–8.1)
eGFR: 91 mL/min/{1.73_m2} (ref >=60)

## 2023-05-04 LAB — HEMOGLOBIN A1C
Hgb A1c MFr Bld: 6.4 % of total Hgb — ABNORMAL HIGH (ref <5.7)
Mean Plasma Glucose: 137 mg/dL
eAG (mmol/L): 7.6 mmol/L

## 2023-05-04 LAB — VITAMIN D 25 HYDROXY (VIT D DEFICIENCY, FRACTURES): Vit D, 25-Hydroxy: 52 ng/mL (ref 30–100)

## 2023-05-04 LAB — LIPID PANEL
Cholesterol: 115 mg/dL (ref <200)
HDL: 50 mg/dL (ref >=50)
LDL Cholesterol (Calc): 51 mg/dL (calc)
Non-HDL Cholesterol (Calc): 65 mg/dL (calc) (ref <130)
Total CHOL/HDL Ratio: 2.3 (calc) (ref <5.0)
Triglycerides: 68 mg/dL (ref <150)

## 2023-05-04 LAB — TSH: TSH: 2.92 mIU/L (ref 0.40–4.50)

## 2023-05-05 ENCOUNTER — Encounter: Payer: Self-pay | Admitting: Family Medicine

## 2023-05-05 NOTE — Assessment & Plan Note (Signed)
Dx many years ago with ablation treatment No concerning symptoms her labs have been normal for several years will continue to monitor  Denies the following symptoms: fatigue, weight gain, feeling cold and cold intolerance, constipation, swelling, feeling slow, losing hair, anxiousness, feeling excessive energy, tremulousness, palpitations, sweating, and weight loss  Labs ordered

## 2023-05-05 NOTE — Assessment & Plan Note (Signed)
Currently managed on norvasc, valsartan-HCTZ 160-25 Pt reports good med compliance and denies any SE.   Blood pressure today is well controlled. BP Readings from Last 3 Encounters:  05/03/23 114/70  09/22/22 136/74  04/21/22 124/66  Continue meds, DASH and work on smoking cessation If bP continues to be well controlled in the future we could consider cutting back on med doses or decreasing medications, currently she is not having any lightheaded episodes or low blood pressure readings

## 2023-05-05 NOTE — Assessment & Plan Note (Signed)
With associated comorbidities of hypertension, hyperlipidemia severely uncontrolled, high ASCVD risk score

## 2023-05-05 NOTE — Assessment & Plan Note (Signed)
Current 1 ppd smoker Does not want to quit right now Sometimes has a cough, no recurrent bronchitis, not currently doing CA screening program - not really interested Tried chantix in the past did not tolerate She did agree to try Wellbutrin the dosing and side effects were reviewed with her encouraged her to start with 1 dose in the morning for several weeks before adding a second dose which I have recommended to do around lunchtime or noon and not later in the day because it may cause insomnia. Smoking cessation instruction/counseling given:  counseled patient on the dangers of tobacco use, advised patient to stop smoking, and reviewed strategies to maximize success

## 2023-05-05 NOTE — Assessment & Plan Note (Signed)
No particular diet efforts with a very strong family history Due for labs today Discussed prediabetes and development of type 2 diabetes which comes with a lot more medications and increased risks of heart disease and stroke which was reviewed with her and she was encouraged to work on diet and lifestyle efforts and if her A1c starts to increase I would recommend monitoring labs more frequently such as every 6 months Lab Results  Component Value Date        HGBA1C 6.1 (H) 04/21/2022   HGBA1C 6.1 (H) 04/19/2021   HGBA1C 6.3 (H) 02/17/2020   HGBA1C 6.1 (H) 08/19/2019   HGBA1C 6.2 (H) 10/16/2018

## 2023-05-11 ENCOUNTER — Encounter: Payer: Self-pay | Admitting: Surgery

## 2023-05-11 ENCOUNTER — Ambulatory Visit: Payer: Medicare Other | Admitting: Surgery

## 2023-05-11 VITALS — BP 145/82 | HR 96 | Temp 98.5°F | Ht 65.5 in | Wt 222.0 lb

## 2023-05-11 DIAGNOSIS — L989 Disorder of the skin and subcutaneous tissue, unspecified: Secondary | ICD-10-CM | POA: Diagnosis not present

## 2023-05-11 NOTE — Progress Notes (Signed)
Patient ID: Laura Kent, female   DOB: [birth year], 72 y.o.   MRN: [id number]  Chief Complaint: Left forearm skin lesion  History of Present Illness Laura Kent is a 72 y.o. female with a raised bump on the left forearm approximately 3 to 4 years duration, suspicious of having foreign body injury remotely.  She denies any drainage, denies any pain.  Has elected to pursue excision of the bump due to its persistence.  She denies any numbness or compromise of function of her left hand.  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   Obesity    Plantar fascial fibromatosis of left foot    Plantar fasciitis 04/21/2017   Reflux       Past Surgical History:  Procedure Laterality Date   BREAST CYST ASPIRATION Right 2011   BUNIONECTOMY     CHOLECYSTECTOMY  09/2013   COLONOSCOPY WITH PROPOFOL N/A 01/17/2017   Procedure: COLONOSCOPY WITH PROPOFOL;  Surgeon: Earline Mayotte, MD;  Location: [facility] ENDOSCOPY;  Service: Endoscopy;  Laterality: N/A;   EYE SURGERY  Feb 2023   Cataracts   POSTERIOR TIBIAL TENDON REPAIR Left 10/26/2018    No Known Allergies  Current Outpatient Medications  Medication Sig Dispense Refill   amLODipine (NORVASC) 5 MG tablet TAKE 1 TABLET BY MOUTH DAILY 90 tablet 0   aspirin EC 81 MG tablet Take 81 mg by mouth daily.     buPROPion (WELLBUTRIN SR) 150 MG 12 hr tablet Take 1 tablet (150 mg total) by mouth 2 (two) times daily. Second dose at lunch time 180 tablet 1   cholecalciferol (VITAMIN D) 1000 units tablet Take 1,000 Units by mouth daily as needed.     Turmeric 500 MG CAPS Take by mouth.     valsartan-hydrochlorothiazide (DIOVAN-HCT) 160-25 MG tablet TAKE 1 TABLET BY MOUTH DAILY 90 tablet  0   Zoster Vaccine Adjuvanted [facility]) injection Inject 0.5 mLs into the muscle once for 1 dose. And repeat once more in 2-6 months 0.5 mL 1   No current facility-administered medications for this visit.    Family History Family History  Problem Relation Age of Onset   Diabetes Mother    Kidney disease Mother    Heart disease Mother    Hypertension Mother    Diabetes Father    Heart disease Father    Hypertension Father    Cancer Brother        leukemia   Heart disease Brother        tachycardia   Stroke Brother        fatal   Diabetes Sister    Colon cancer Maternal Aunt    Diabetes Maternal Grandmother    Diabetes Brother    Stroke Brother    Heart disease Brother    Heart disease Brother    Breast cancer Neg Hx       Social History Social History   Tobacco Use   Smoking status: Every Day    Packs/day: 0.50    Years: 34.00    Additional pack years: 0.00    Total pack years: 17.00    Types: Cigarettes    Last attempt to quit: 05/18/2012    Years since  quitting: 10.9   Smokeless tobacco: Never   Tobacco comments:    Quit 2013 started back 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  Vaping Use   Vaping Use: Never used  Substance Use Topics   Alcohol use: No   Drug use: No        Review of Systems  Constitutional: Negative.   HENT: Negative.    Respiratory: Negative.    Cardiovascular: Negative.   Gastrointestinal: Negative.   Genitourinary: Negative.   Skin: Negative.   Neurological: Negative.   Psychiatric/Behavioral: Negative.      Physical Exam Blood pressure (!) 145/82, pulse 96, temperature 98.5 F (36.9 C), temperature source Oral, height 5' 5.5" (1.664 m), weight 222 lb (100.7 kg), SpO2 99 %. Last Weight  Most recent update: 05/11/2023  2:45 PM    Weight  100.7 kg (222 lb)             CONSTITUTIONAL: Well developed, and nourished, appropriately responsive and aware without distress.   EYES: Sclera non-icteric.   EARS, NOSE, MOUTH AND THROAT:  The oropharynx  is clear. Oral mucosa is pink and moist.    Hearing is intact to voice.  NECK: Trachea is midline, and there is no jugular venous distension.  LYMPH NODES:  Lymph nodes in the neck are not appreciated. RESPIRATORY:   Normal respiratory effort without pathologic use of accessory muscles. CARDIOVASCULAR: Well perfused.  GI: The abdomen is  soft, nontender, and nondistended. There were no palpable masses. MUSCULOSKELETAL:  Symmetrical muscle tone appreciated in all four extremities.    SKIN: Skin turgor is normal. No pathologic skin lesions appreciated.   There is a well-defined 1.2 cm, raised firm but mobile skin lesion consistent with an epidermal overgrowth granuloma perhaps.  Nontender, no fluctuance, erythema or induration.   NEUROLOGIC:  Motor and sensation appear grossly normal.  Cranial nerves are grossly without defect. PSYCH:  Alert and oriented to person, place and time. Affect is appropriate for situation.  Data Reviewed I have personally reviewed what is currently available of the patient's imaging, recent labs and medical records.   Labs:     Latest Ref Rng & Units 05/03/2023   10:01 AM 04/21/2022    9:06 AM 04/19/2021   11:35 AM  CBC  WBC 3.8 - 10.8 Thousand/uL 4.8  5.4  5.1   Hemoglobin 11.7 - 15.5 g/dL 40.9  81.1  91.4   Hematocrit 35.0 - 45.0 % 44.2  43.0  42.1   Platelets 140 - 400 Thousand/uL 266  269  273       Latest Ref Rng & Units 05/03/2023   10:01 AM 04/21/2022    9:06 AM 04/19/2021   11:35 AM  CMP  Glucose 65 - 99 mg/dL 95  97  89   BUN 7 - 25 mg/dL 14  15  16    Creatinine 0.60 - 1.00 mg/dL 7.82  9.56  2.13   Sodium 135 - 146 mmol/L 142  143  141   Potassium 3.5 - 5.3 mmol/L 4.0  3.9  4.5   Chloride 98 - 110 mmol/L 104  105  105   CO2 20 - 32 mmol/L 30  29  30    Calcium 8.6 - 10.4 mg/dL 08.6  57.8  46.9   Total Protein 6.1 - 8.1 g/dL 6.7  6.8  6.6   Total Bilirubin 0.2 - 1.2 mg/dL 0.5  0.4  0.4   AST 10 - 35 U/L 13  13  15    ALT 6 -  29 U/L 11  13  14       Imaging: Radiological images reviewed:   Within last 24 hrs: No results found.  Assessment    Skin lesion left forearm, 1.2 cm.  Patient Active Problem List   Diagnosis Date Noted   Prediabetes 08/19/2019   Tobacco abuse 11/24/2017   Hyperthyroidism 04/30/2017   Benign hypertension 05/19/2015   Class 2 severe obesity with serious comorbidity and body mass index (BMI) of 37.0 to 37.9 in adult [facility]) 05/19/2015    Plan    Excision of left forearm lesion under local anesthesia, will schedule for next week.  We discussed the risks of local anesthesia, bleeding, infection, recurrence.  Believe she understands potential for neuropathy or numbness.  Following procedure in this location.  She desires to proceed.  No guarantees were expressed or implied.  Face-to-face time spent with the patient and accompanying care providers(if present) was 15 minutes, with more than 50% of the time spent counseling, educating, and coordinating care of the patient.    These notes generated with voice recognition software. I apologize for typographical errors.  Campbell Lerner M.D., FACS 05/11/2023, 2:47 PM

## 2023-05-11 NOTE — Patient Instructions (Addendum)
If you have any concerns or questions, please feel free to call our office. See follow up appointment.   Skin Foreign Body A skin foreign body is an object that is stuck in the skin. Common objects that get stuck in the skin include: Wood splinters. Glass or fiberglass slivers. Rocks or gravel. Metallic objects, such as nails, needles, fish hooks, and BBs. Thorns and cactus spines. Foreign bodies may damage tissue or cause infection. If the foreign body does not cause any pain or infection, it may be okay to leave it in the skin. A growth called a granuloma may form around a foreign body that is left in the skin. What are the causes? This condition is caused by an object getting lodged under the skin, usually by accident. What increases the risk? Children who play in areas with wood, metal, or glass are at a higher risk of getting a foreign body. Adults may get a skin foreign body after breaking glass or while working with wood, fiberglass, or stone. In some cases, the object may get stuck in an open wound after an injury. What are the signs or symptoms? Symptoms of this condition include: Pain or tenderness. A feeling of something being stuck under the skin. Redness. Swelling. How is this diagnosed? This condition is diagnosed based on: Your medical history and symptoms. A physical exam. Imaging tests, such as: X-rays. CT scans. Ultrasounds. How is this treated? Treatment for this condition depends on what the foreign body is, where it is, and whether it is causing infection or other symptoms. Treatment may involve: Flushing the affected area with a salt water solution to remove dirt or debris. Removing all or part of the object with a needle and metal tweezers. In some cases, an incision may be made in the skin to allow access to the object. Waiting to remove the object until it moves closer to the surface of the skin. This may take several days. Leaving the object in place. This  may be done if the object is not causing any symptoms or if removal will cause more damage to the skin or tissue. Taking antibiotic medicines or using antibiotic ointment to treat or prevent infection. Having a surgical procedure to remove a foreign body that is deep inside the tissue or that has been covered by a granuloma. Follow these instructions at home: Wound or incision care  If the foreign body was removed, follow instructions from your health care provider about how to take care of your wound or incision. Make sure you: Wash your hands with soap and water for at least 20 seconds before and after you change your bandage (dressing). If soap and water are not available, use hand sanitizer. Change your dressing as told by your health care provider. Leave stitches (sutures), skin glue, or adhesive strips in place. These skin closures may need to stay in place for 2 weeks or longer. If adhesive strip edges start to loosen and curl up, you may trim the loose edges. Do not remove adhesive strips completely unless your health care provider tells you to do that. Check your wound or incision every day for signs of infection. This is especially important if the foreign body was left in place in the skin. Check for: More redness, swelling, or pain. More fluid or blood. Warmth. Pus or a bad smell. If the foreign body was in your lip, you may be directed to rinse your mouth with a salt water mixture 3-4 times per day  or as needed. To make salt water, completely dissolve -1 tsp (3-6 g) of salt in 1 cup (237 mL) of warm water. General instructions Take over-the-counter and prescription medicines only as told by your health care provider. If you were prescribed an antibiotic medicine or ointment, use it as told by your health care provider. Do not stop using the antibiotic even if you start to feel better. Keep all follow-up visits. This is important. Contact a health care provider if: You develop more  pain or other new symptoms around the area where the object entered the skin. You have more redness, swelling, or pain around your wound or incision. You have more fluid or blood coming from your wound or incision. Your wound or incision feels warm to the touch. You have pus or a bad smell coming from your wound or incision. You have a fever. Get help right away if: You have severe pain that does not get better with medicine. Summary A skin foreign body is an object that is stuck in the skin. Common objects that get stuck in the skin include wood, glass, rocks, thorns, and fiberglass slivers. Treatment for this condition depends on what the foreign body is, where it is, and whether it is causing infection or other symptoms. Treatment may include removing the foreign body or leaving it in place. It is important to watch the wound or incision for signs of infection, especially if the object was left in place in the skin. This information is not intended to replace advice given to you by your health care provider. Make sure you discuss any questions you have with your health care provider. Document Revised: 08/13/2021 Document Reviewed: 08/13/2021 Elsevier Patient Education  2024 [employer].

## 2023-05-18 ENCOUNTER — Ambulatory Visit (INDEPENDENT_AMBULATORY_CARE_PROVIDER_SITE_OTHER): Payer: Medicare Other | Admitting: Surgery

## 2023-05-18 ENCOUNTER — Other Ambulatory Visit: Payer: Self-pay | Admitting: Surgery

## 2023-05-18 ENCOUNTER — Encounter: Payer: Self-pay | Admitting: Surgery

## 2023-05-18 VITALS — BP 145/80 | HR 106 | Temp 98.5°F | Ht 65.5 in | Wt 221.2 lb

## 2023-05-18 DIAGNOSIS — L989 Disorder of the skin and subcutaneous tissue, unspecified: Secondary | ICD-10-CM

## 2023-05-18 DIAGNOSIS — D2362 Other benign neoplasm of skin of left upper limb, including shoulder: Secondary | ICD-10-CM | POA: Diagnosis not present

## 2023-05-18 NOTE — Patient Instructions (Addendum)
If you have any concerns or questions, please feel free to call our office. See follow up appointment.   Excision of Skin Lesions, Care After The following information offers guidance on how to care for yourself after your procedure. Your health care provider may also give you more specific instructions. If you have problems or questions, contact your health care provider. What can I expect after the procedure? After your procedure, it is common to have: Soreness or mild pain. Some redness and swelling. Follow these instructions at home: Excision site care  Follow instructions from your health care provider about how to take care of your excision site. Make sure you: Wash your hands with soap and water for at least 20 seconds before and after you change your bandage (dressing). If soap and water are not available, use hand sanitizer. Change your dressing as told by your health care provider. Leave stitches (sutures), skin glue, or adhesive strips in place. These skin closures may need to stay in place for 2 weeks or longer. If adhesive strip edges start to loosen and curl up, you may trim the loose edges. Do not remove adhesive strips completely unless your health care provider tells you to do that. Check the excision area every day for signs of infection. Watch for: More redness, swelling, or pain. Fluid or blood. Warmth. Pus or a bad smell. Keep the site clean, dry, and protected for at least 48 hours. For bleeding, apply gentle but firm pressure to the area using a folded towel for 20 minutes. Do not take baths, swim, or use a hot tub until your health care provider approves. Ask your health care provider if you may take showers. You may only be allowed to take sponge baths. General instructions Take over-the-counter and prescription medicines only as told by your health care provider. Follow instructions from your health care provider about how to minimize scarring. Scarring should  lessen over time. Avoid sun exposure until the area has healed. Use sunscreen to protect the area from the sun after it has healed. Avoid high-impact exercise and activities until the sutures are removed or the area heals. Keep all follow-up visits. This is important. Contact a health care provider if: You have more redness, swelling, or pain around your excision site. You have fluid or blood coming from your excision site. Your excision site feels warm to the touch. You have pus or a bad smell coming from your excision site. You have a fever. You have pain that does not improve in 2-3 days after your procedure. Get help right away if: You have bleeding that does not stop with pressure or a dressing. Your wound opens up. Summary Take over-the-counter and prescription medicines only as told by your health care provider. Change your dressing as told by your health care provider. Contact a health care provider if you have redness, swelling, pain, or other signs of infection around your excision site. Keep all follow-up visits. This is important. This information is not intended to replace advice given to you by your health care provider. Make sure you discuss any questions you have with your health care provider. Document Revised: 06/22/2021 Document Reviewed: 06/22/2021 Elsevier Patient Education  2024 [employer]  

## 2023-05-18 NOTE — Progress Notes (Signed)
Excision of 1.2 cm skin lesion left forearm  Pre-operative Diagnosis: Skin lesion left forearm  Post-operative Diagnosis: same.    Surgeon: Campbell Lerner, M.D., FACS  Anesthesia: Local   Findings: Process limited to skin  Estimated Blood Loss: 1 mL         Specimens: 1.2 cm skin lesion with adjacent skin and immediate subcutaneous tissues.  Sent for permanent section.          Complications: none              Procedure Details  The patient was evaluated, the benefits, complications, treatment options, and expected outcomes were discussed with the patient. The risks of bleeding, infection, recurrence of symptoms, failure to resolve symptoms, unanticipated injury, any of which could require further surgery were reviewed with the patient. The likelihood of improving the patient's symptoms with return to their baseline status is expected.  The patient and/or family concurred with the proposed plan, giving informed consent.  The patient was taken to our procedure room, identified and the procedure verified.    The patient was positioned in the supine position and the left forearm was prepped with  Chloraprep and draped in the sterile fashion.  A Time Out was held and the above information confirmed.  Local infiltration with 1% Xylocaine with epinephrine is supplied applied to the skin and subcutaneous tissues immediately surrounding the lesion in question.  An elliptical incision is made along the lines of the longitudinal bony axis.  Hemostasis obtained with pressure.  The skin was reapproximated with interrupted 3-0 Vicryl dermals.  The skin was sealed with Dermabond.  Patient Toller procedure well.  Your incision was closed with Dermabond.  It is best to keep it clean and dry, it will tolerate a brief shower, but do not soak it or apply any creams or lotions to the incisions.  The Dermabond should gradually flake off over time.  Keep it open to air so you can evaluate your incisions.   Dermabond assists the underlying sutures to keep your incision closed and protected from infection.  Should you develop some drainage from your incision, some drops of drainage would be okay but if it persists continue to put keep a dry dressing over it.

## 2023-05-24 NOTE — Progress Notes (Unsigned)
[facility] POST-OP OFFICE VISIT  05/25/2023  HPI: Laura Kent is a 72 y.o. female 7 days s/p excision of lesion left forearm.  Notes minimal soreness.  Dermabond still intact.  Vital signs: BP (!) 144/82   Pulse 89   Temp 97.9 F (36.6 C) (Oral)   Ht 5\' 5"  (1.651 m)   Wt 220 lb 3.2 oz (99.9 kg)   SpO2 97%   BMI 36.64 kg/m    Physical Exam: Constitutional: Appears well nontoxic. Skin: Left forearm with Dermabond still intact.  Gradual flaking at edges.  No evidence of erythema, or underlying wound disruption.  Assessment/Plan: This is a 72 y.o. female 7 days s/p excision of dermatofibroma left forearm.  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  We discussed role of Dermabond, and when she can shower more liberally.  Will have be happy to see her if any concerns arise about her incision.   Campbell Lerner M.D., FACS 05/25/2023, 2:31 PM

## 2023-05-25 ENCOUNTER — Ambulatory Visit (INDEPENDENT_AMBULATORY_CARE_PROVIDER_SITE_OTHER): Payer: Medicare Other | Admitting: Surgery

## 2023-05-25 ENCOUNTER — Encounter: Payer: Self-pay | Admitting: Surgery

## 2023-05-25 VITALS — BP 144/82 | HR 89 | Temp 97.9°F | Ht 65.0 in | Wt 220.2 lb

## 2023-05-25 DIAGNOSIS — D2362 Other benign neoplasm of skin of left upper limb, including shoulder: Secondary | ICD-10-CM | POA: Insufficient documentation

## 2023-05-25 DIAGNOSIS — Z09 Encounter for follow-up examination after completed treatment for conditions other than malignant neoplasm: Secondary | ICD-10-CM

## 2023-05-25 DIAGNOSIS — L989 Disorder of the skin and subcutaneous tissue, unspecified: Secondary | ICD-10-CM

## 2023-05-25 NOTE — Patient Instructions (Signed)
If you have any concerns or questions, please feel free to call our office.   Excision of Skin Lesions, Care After The following information offers guidance on how to care for yourself after your procedure. Your health care provider may also give you more specific instructions. If you have problems or questions, contact your health care provider. What can I expect after the procedure? After your procedure, it is common to have: Soreness or mild pain. Some redness and swelling. Follow these instructions at home: Excision site care  Follow instructions from your health care provider about how to take care of your excision site. Make sure you: Wash your hands with soap and water for at least 20 seconds before and after you change your bandage (dressing). If soap and water are not available, use hand sanitizer. Change your dressing as told by your health care provider. Leave stitches (sutures), skin glue, or adhesive strips in place. These skin closures may need to stay in place for 2 weeks or longer. If adhesive strip edges start to loosen and curl up, you may trim the loose edges. Do not remove adhesive strips completely unless your health care provider tells you to do that. Check the excision area every day for signs of infection. Watch for: More redness, swelling, or pain. Fluid or blood. Warmth. Pus or a bad smell. Keep the site clean, dry, and protected for at least 48 hours. For bleeding, apply gentle but firm pressure to the area using a folded towel for 20 minutes. Do not take baths, swim, or use a hot tub until your health care provider approves. Ask your health care provider if you may take showers. You may only be allowed to take sponge baths. General instructions Take over-the-counter and prescription medicines only as told by your health care provider. Follow instructions from your health care provider about how to minimize scarring. Scarring should lessen over time. Avoid sun  exposure until the area has healed. Use sunscreen to protect the area from the sun after it has healed. Avoid high-impact exercise and activities until the sutures are removed or the area heals. Keep all follow-up visits. This is important. Contact a health care provider if: You have more redness, swelling, or pain around your excision site. You have fluid or blood coming from your excision site. Your excision site feels warm to the touch. You have pus or a bad smell coming from your excision site. You have a fever. You have pain that does not improve in 2-3 days after your procedure. Get help right away if: You have bleeding that does not stop with pressure or a dressing. Your wound opens up. Summary Take over-the-counter and prescription medicines only as told by your health care provider. Change your dressing as told by your health care provider. Contact a health care provider if you have redness, swelling, pain, or other signs of infection around your excision site. Keep all follow-up visits. This is important. This information is not intended to replace advice given to you by your health care provider. Make sure you discuss any questions you have with your health care provider. Document Revised: 06/22/2021 Document Reviewed: 06/22/2021 Elsevier Patient Education  2024 [employer].

## 2023-06-27 ENCOUNTER — Other Ambulatory Visit: Payer: Self-pay | Admitting: Family Medicine

## 2023-06-27 DIAGNOSIS — I10 Essential (primary) hypertension: Secondary | ICD-10-CM

## 2023-06-28 ENCOUNTER — Other Ambulatory Visit: Payer: Self-pay | Admitting: Family Medicine

## 2023-06-28 DIAGNOSIS — I10 Essential (primary) hypertension: Secondary | ICD-10-CM

## 2023-10-03 ENCOUNTER — Ambulatory Visit: Payer: Medicare Other

## 2023-10-03 DIAGNOSIS — Z23 Encounter for immunization: Secondary | ICD-10-CM

## 2023-10-03 DIAGNOSIS — Z719 Counseling, unspecified: Secondary | ICD-10-CM

## 2023-10-03 NOTE — Progress Notes (Signed)
Patient seen in nurse clinic with husband.  [facility] [phone] and Flu HD given.  Tolerated well. VIS provided. [facility] updated and copy provided. Waited 10 minutes.

## 2023-10-06 ENCOUNTER — Ambulatory Visit
Admission: RE | Admit: 2023-10-06 | Discharge: 2023-10-06 | Disposition: A | Discharge status: Home / Self Care | Payer: Medicare Other | Source: Ambulatory Visit | Attending: Family Medicine | Admitting: Family Medicine

## 2023-10-06 DIAGNOSIS — Z1231 Encounter for screening mammogram for malignant neoplasm of breast: Secondary | ICD-10-CM | POA: Diagnosis not present

## 2023-10-10 ENCOUNTER — Other Ambulatory Visit: Payer: Self-pay | Admitting: Family Medicine

## 2023-10-10 DIAGNOSIS — R928 Other abnormal and inconclusive findings on diagnostic imaging of breast: Secondary | ICD-10-CM

## 2023-10-10 DIAGNOSIS — R921 Mammographic calcification found on diagnostic imaging of breast: Secondary | ICD-10-CM

## 2023-10-13 ENCOUNTER — Other Ambulatory Visit: Payer: Self-pay | Admitting: Family Medicine

## 2023-10-13 DIAGNOSIS — R921 Mammographic calcification found on diagnostic imaging of breast: Secondary | ICD-10-CM

## 2023-10-13 DIAGNOSIS — R928 Other abnormal and inconclusive findings on diagnostic imaging of breast: Secondary | ICD-10-CM

## 2023-10-16 ENCOUNTER — Ambulatory Visit
Admission: RE | Admit: 2023-10-16 | Discharge: 2023-10-16 | Disposition: A | Discharge status: Home / Self Care | Payer: Medicare Other | Source: Ambulatory Visit | Attending: Family Medicine | Admitting: Family Medicine

## 2023-10-16 DIAGNOSIS — R928 Other abnormal and inconclusive findings on diagnostic imaging of breast: Secondary | ICD-10-CM | POA: Diagnosis not present

## 2023-10-16 DIAGNOSIS — R921 Mammographic calcification found on diagnostic imaging of breast: Secondary | ICD-10-CM | POA: Diagnosis not present

## 2023-10-16 DIAGNOSIS — R92321 Mammographic fibroglandular density, right breast: Secondary | ICD-10-CM | POA: Diagnosis not present

## 2023-10-25 DIAGNOSIS — H35033 Hypertensive retinopathy, bilateral: Secondary | ICD-10-CM | POA: Diagnosis not present

## 2023-11-06 ENCOUNTER — Ambulatory Visit (INDEPENDENT_AMBULATORY_CARE_PROVIDER_SITE_OTHER): Payer: Medicare Other | Admitting: Physician Assistant

## 2023-11-06 ENCOUNTER — Encounter: Payer: Self-pay | Admitting: Physician Assistant

## 2023-11-06 VITALS — BP 126/78 | HR 94 | Resp 16 | Ht 65.0 in | Wt 218.0 lb

## 2023-11-06 DIAGNOSIS — R7303 Prediabetes: Secondary | ICD-10-CM | POA: Diagnosis not present

## 2023-11-06 DIAGNOSIS — F17210 Nicotine dependence, cigarettes, uncomplicated: Secondary | ICD-10-CM

## 2023-11-06 DIAGNOSIS — Z6836 Body mass index (BMI) 36.0-36.9, adult: Secondary | ICD-10-CM

## 2023-11-06 DIAGNOSIS — I10 Essential (primary) hypertension: Secondary | ICD-10-CM

## 2023-11-06 DIAGNOSIS — Z720 Tobacco use: Secondary | ICD-10-CM

## 2023-11-06 DIAGNOSIS — Z716 Tobacco abuse counseling: Secondary | ICD-10-CM

## 2023-11-06 DIAGNOSIS — E66812 Obesity, class 2: Secondary | ICD-10-CM | POA: Diagnosis not present

## 2023-11-06 DIAGNOSIS — E0590 Thyrotoxicosis, unspecified without thyrotoxic crisis or storm: Secondary | ICD-10-CM | POA: Diagnosis not present

## 2023-11-06 MED ORDER — BUPROPION HCL ER (XL) 150 MG PO TB24: 150.0000 mg | ORAL_TABLET | Freq: Every day | ORAL | 0 refills | Status: DC

## 2023-11-06 MED ORDER — BUPROPION HCL ER (SR) 150 MG PO TB12: 150.0000 mg | ORAL_TABLET | Freq: Every day | ORAL | 1 refills | Status: DC

## 2023-11-06 NOTE — Progress Notes (Signed)
Established Patient Office Visit  Name: Laura Kent   MRN: [id number]    DOB: 1951-03-11   Date:11/06/2023  Today's Provider: Jacquelin Hawking, MHS, PA-C Introduced myself to the patient as a PA-C and provided education on APPs in clinical practice.         Subjective  Chief Complaint  Chief Complaint  Patient presents with   Follow-up   Hypertension   Medication Refill    HPI   Hypertension: - Medications: Valsartan-hydrochlorothiazide 160-25 mg pO every day  - Compliance: good  - Checking BP at home: sometimes checking- about 1-3 times per month Avg 120-130s/80s - Denies any SOB, CP, vision changes, LE edema, medication SEs, or symptoms of hypotension   Prediabetes Most recent A1c: 6.4 % Diet: she was trying to reduce sugar intake and fats but Thanksgiving was difficult to be mindful  Exercise:she is limited by arthritis pain. She tries to get up frequently throughout the day to walk a bit but does not do this for long periods   Hyperthyroidism  Had ablation several years ago She is not currently on thyroid medication Most recent TSH was 2.92 in May 2024 She has cut her smoking down by about half   Mood She ran out of Wellbutrin some time ago She is only able to take one per day due to nausea with the second dose  She is still smoking but reports her mood is calmer She would like to continue on Wellbutrin for now    Patient Active Problem List   Diagnosis Date Noted   Dermatofibroma of left wrist 05/25/2023   Benign skin lesion of forearm 05/11/2023   Prediabetes 08/19/2019   Tobacco abuse 11/24/2017   Hyperthyroidism 04/30/2017   Benign hypertension 05/19/2015   Class 2 severe obesity with serious comorbidity and body mass index (BMI) of 36.0 to 36.9 in adult [facility]) 05/19/2015    Past Surgical History:  Procedure Laterality Date   BREAST CYST ASPIRATION Right 2011   BUNIONECTOMY     CHOLECYSTECTOMY  09/2013   COLONOSCOPY WITH PROPOFOL N/A  01/17/2017   Procedure: COLONOSCOPY WITH PROPOFOL;  Surgeon: Earline Mayotte, MD;  Location: [facility] ENDOSCOPY;  Service: Endoscopy;  Laterality: N/A;   EYE SURGERY  Feb 2023   Cataracts   POSTERIOR TIBIAL TENDON REPAIR Left 10/26/2018    Family History  Problem Relation Age of Onset   Diabetes Mother    Kidney disease Mother    Heart disease Mother    Hypertension Mother    Diabetes Father    Heart disease Father    Hypertension Father    Cancer Brother        leukemia   Heart disease Brother        tachycardia   Stroke Brother        fatal   Diabetes Sister    Colon cancer Maternal Aunt    Diabetes Maternal Grandmother    Diabetes Brother    Stroke Brother    Heart disease Brother    Heart disease Brother    Breast cancer Neg Hx     Social History   Tobacco Use   Smoking status: Every Day    Current packs/day: 0.00    Average packs/day: 0.5 packs/day for 34.0 years (17.0 ttl pk-yrs)    Types: Cigarettes    Start date: 05/18/1978    Last attempt to quit: 05/18/2012    Years since quitting: 11.4  Smokeless tobacco: Never   Tobacco comments:    Quit 2013 started back 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  Substance Use Topics   Alcohol use: No     Current Outpatient Medications:    amLODipine (NORVASC) 5 MG tablet, TAKE 1 TABLET BY MOUTH DAILY, Disp: 90 tablet, Rfl: 3   aspirin EC 81 MG tablet, Take 81 mg by mouth daily., Disp: , Rfl:    buPROPion (WELLBUTRIN XL) 150 MG 24 hr tablet, Take 1 tablet (150 mg total) by mouth daily., Disp: 30 tablet, Rfl: 0   cholecalciferol (VITAMIN D) 1000 units tablet, Take 1,000 Units by mouth daily as needed., Disp: , Rfl:    Turmeric 500 MG CAPS, Take by mouth., Disp: , Rfl:    valsartan-hydrochlorothiazide (DIOVAN-HCT) 160-25 MG tablet, TAKE 1 TABLET BY MOUTH DAILY, Disp: 90 tablet, Rfl: 3  No Known Allergies  I personally reviewed active problem list, medication list, allergies, health maintenance, notes from last encounter, lab results with the  patient/caregiver today.   Review of Systems  Constitutional:  Negative for malaise/fatigue and weight loss.  Eyes:  Negative for blurred vision and double vision.  Respiratory:  Negative for shortness of breath and wheezing.   Cardiovascular:  Negative for chest pain, palpitations and leg swelling.  Gastrointestinal:  Negative for constipation, diarrhea, heartburn, nausea and vomiting.  Musculoskeletal:  Negative for falls.  Neurological:  Negative for dizziness, tingling, loss of consciousness and headaches.      Objective  Vitals:   11/06/23 1053  BP: 126/78  Pulse: 94  Resp: 16  SpO2: 98%  Weight: 218 lb (98.9 kg)  Height: 5\' 5"  (1.651 m)    Body mass index is 36.28 kg/m.  Physical Exam Vitals reviewed.  Constitutional:      General: She is awake.     Appearance: Normal appearance. She is well-developed and well-groomed.  HENT:     Head: Normocephalic and atraumatic.  Cardiovascular:     Rate and Rhythm: Normal rate and regular rhythm.     Pulses: Normal pulses.          Radial pulses are 2+ on the right side and 2+ on the left side.     Heart sounds: Normal heart sounds. No murmur heard.    No friction rub. No gallop.  Pulmonary:     Effort: Pulmonary effort is normal.     Breath sounds: Normal breath sounds. No decreased air movement. No decreased breath sounds, wheezing, rhonchi or rales.  Musculoskeletal:     Cervical back: Normal range of motion.     Right lower leg: No edema.     Left lower leg: No edema.  Neurological:     General: No focal deficit present.     Mental Status: She is alert and oriented to person, place, and time. Mental status is at baseline.     GCS: GCS eye subscore is 4. GCS verbal subscore is 5. GCS motor subscore is 6.  Psychiatric:        Attention and Perception: Attention and perception normal.        Mood and Affect: Mood and affect normal.        Speech: Speech normal.        Behavior: Behavior normal. Behavior is  cooperative.        Thought Content: Thought content normal.        Cognition and Memory: Cognition normal.      No results found for this or any previous visit (  from the past 2160 hour(s)).   PHQ2/9:    11/06/2023   10:52 AM 05/03/2023    8:54 AM 03/31/2023    2:31 PM 09/22/2022    9:24 AM 04/21/2022    8:29 AM  Depression screen PHQ 2/9  Decreased Interest 0 0 0 0 0  Down, Depressed, Hopeless 0 0 0 0 0  PHQ - 2 Score 0 0 0 0 0  Altered sleeping 0 0  0 0  Tired, decreased energy 0 0  0 0  Change in appetite 0 0  0 0  Feeling bad or failure about yourself  0 0  0 0  Trouble concentrating 0 0  0 0  Moving slowly or fidgety/restless 0 0  0 0  Suicidal thoughts 0 0  0 0  PHQ-9 Score 0 0  0 0  Difficult doing work/chores  Not difficult at all  Not difficult at all       Fall Risk:    11/06/2023   10:52 AM 05/03/2023    8:54 AM 03/28/2023    9:40 AM 09/22/2022    9:24 AM 04/21/2022    8:29 AM  Fall Risk   Falls in the past year? 0 0 0 0 0  Number falls in past yr: 0 0 0 0 0  Injury with Fall? 0 0 0 0 0  Risk for fall due to : No Fall Risks No Fall Risks Medication side effect No Fall Risks No Fall Risks  Follow up Falls prevention discussed Falls prevention discussed;Education provided;Falls evaluation completed Falls prevention discussed;Education provided;Falls evaluation completed Falls prevention discussed;Education provided Falls prevention discussed      Functional Status Survey: Is the patient deaf or have difficulty hearing?: No Does the patient have difficulty seeing, even when wearing glasses/contacts?: No Does the patient have difficulty concentrating, remembering, or making decisions?: No Does the patient have difficulty walking or climbing stairs?: No Does the patient have difficulty dressing or bathing?: No Does the patient have difficulty doing errands alone such as visiting a doctor's office or shopping?: No    Assessment & Plan  Problem List Items  Addressed This Visit       Cardiovascular and Mediastinum   Benign hypertension (Chronic)    Chronic, historic condition Appears well managed on current regimen comprised of Valsartan-hydrochlorothiazide 160-25 mg PO every day, Amlodipine 5 mg PO every day  Continue current regimen Encouraged exercise as tolerated to improve overall CV health  Follow up in 6 months or sooner if concerns arise        Relevant Orders   COMPLETE METABOLIC PANEL WITH GFR   CBC w/Diff/Platelet     Endocrine   Hyperthyroidism - Primary    Per chart review: Patient had ablation therapy several years ago Will recheck TSH, T4 for monitoring Results to dictate further management  Follow up in 6 months or sooner if concerns arise        Relevant Orders   TSH   T4     Other   Class 2 severe obesity with serious comorbidity and body mass index (BMI) of 36.0 to 36.9 in adult [facility])    Chronic, ongoing Patient is several comorbidities comprised of HTN, HLD, prediabetes Recommend changes to lifestyle including diet and exercise to assist with management and overall health Will provide medication management to assist with HTN and HLD Follow-up in 6 months or sooner if concerns arise      Relevant Orders   HgB A1c  Lipid Profile   Tobacco abuse    Chronic, ongoing  Goal for this year: get down to quarter of a pack per day -patient in agreement with this goal We discussed cessation efforts and progress for about 5 minutes of apt along with wellbutrin effects and changes  Will continue Wellbutrin per patient request  Will provide Wellbutrin 150 XL for all day coverage since patient reports BID dosing with regular 150 mg made her feel bad Will provide 30 day supply for now and will send refills based on patient preference Follow up in 3 months or sooner if concerns arise        Relevant Medications   buPROPion (WELLBUTRIN XL) 150 MG 24 hr tablet   Prediabetes    Recheck A1c Results to dictate  further management        Relevant Orders   HgB A1c   Other Visit Diagnoses     Encounter for smoking cessation counseling       Relevant Medications   buPROPion (WELLBUTRIN XL) 150 MG 24 hr tablet        Return in about 6 months (around 05/06/2024) for HTN, prediabetes, smoking cessation .   I, Consuela Widener E Elizabelle Fite, PA-C, have reviewed all documentation for this visit. The documentation on 11/06/23 for the exam, diagnosis, procedures, and orders are all accurate and complete.   Jacquelin Hawking, MHS, PA-C [facility]

## 2023-11-06 NOTE — Assessment & Plan Note (Signed)
Chronic, historic condition Appears well managed on current regimen comprised of Valsartan-hydrochlorothiazide 160-25 mg PO every day, Amlodipine 5 mg PO every day  Continue current regimen Encouraged exercise as tolerated to improve overall CV health  Follow up in 6 months or sooner if concerns arise

## 2023-11-06 NOTE — Patient Instructions (Addendum)
You can try using Voltaren gel on your arthritis along with Ibuprofen, Aleve or Advil  Make sure you are staying active to keep your joints from getting stiff You can use Lidocaine patches and warm compresses as well   Please call us and let us know if you like the longer acting Wellbutrin or if you want to go back to the other and I will send it in for you It was nice to meet you and I appreciate the opportunity to be involved in your care If you were satisfied with the care you received from me, I would greatly appreciate you saying so in the after-visit survey that is sent out following our visit.

## 2023-11-06 NOTE — Assessment & Plan Note (Signed)
 Recheck A1c Results to dictate further management

## 2023-11-06 NOTE — Assessment & Plan Note (Signed)
Chronic, ongoing Patient is several comorbidities comprised of HTN, HLD, prediabetes Recommend changes to lifestyle including diet and exercise to assist with management and overall health Will provide medication management to assist with HTN and HLD Follow-up in 6 months or sooner if concerns arise

## 2023-11-06 NOTE — Assessment & Plan Note (Addendum)
Chronic, ongoing  Goal for this year: get down to quarter of a pack per day -patient in agreement with this goal We discussed cessation efforts and progress for about 5 minutes of apt along with wellbutrin effects and changes  Will continue Wellbutrin per patient request  Will provide Wellbutrin 150 XL for all day coverage since patient reports BID dosing with regular 150 mg made her feel bad Will provide 30 day supply for now and will send refills based on patient preference Follow up in 3 months or sooner if concerns arise

## 2023-11-06 NOTE — Assessment & Plan Note (Signed)
Per chart review: Patient had ablation therapy several years ago Will recheck TSH, T4 for monitoring Results to dictate further management  Follow up in 6 months or sooner if concerns arise

## 2023-11-07 LAB — LIPID PANEL
Cholesterol: 120 mg/dL
HDL: 49 mg/dL — ABNORMAL LOW
LDL Cholesterol (Calc): 56 mg/dL
Non-HDL Cholesterol (Calc): 71 mg/dL
Total CHOL/HDL Ratio: 2.4 (calc)
Triglycerides: 74 mg/dL

## 2023-11-07 LAB — COMPLETE METABOLIC PANEL WITHOUT GFR
AG Ratio: 1.4 (calc) (ref 1.0–2.5)
ALT: 11 U/L (ref 6–29)
AST: 12 U/L (ref 10–35)
Albumin: 4 g/dL (ref 3.6–5.1)
Alkaline phosphatase (APISO): 74 U/L (ref 37–153)
BUN: 13 mg/dL (ref 7–25)
CO2: 31 mmol/L (ref 20–32)
Calcium: 10.4 mg/dL (ref 8.6–10.4)
Chloride: 104 mmol/L (ref 98–110)
Creat: 0.75 mg/dL (ref 0.60–1.00)
Globulin: 2.9 g/dL (ref 1.9–3.7)
Glucose, Bld: 95 mg/dL (ref 65–99)
Potassium: 4 mmol/L (ref 3.5–5.3)
Sodium: 141 mmol/L (ref 135–146)
Total Bilirubin: 0.4 mg/dL (ref 0.2–1.2)
Total Protein: 6.9 g/dL (ref 6.1–8.1)
eGFR: 85 mL/min/1.73m2

## 2023-11-07 LAB — T4: T4, Total: 8.2 ug/dL (ref 5.1–11.9)

## 2023-11-07 LAB — CBC WITH DIFFERENTIAL/PLATELET
Absolute Lymphocytes: 1758 {cells}/uL (ref 850–3900)
Absolute Monocytes: 416 {cells}/uL (ref 200–950)
Basophils Absolute: 42 {cells}/uL (ref 0–200)
Basophils Relative: 0.8 %
Eosinophils Absolute: 68 {cells}/uL (ref 15–500)
Eosinophils Relative: 1.3 %
HCT: 43.3 % (ref 35.0–45.0)
Hemoglobin: 14.5 g/dL (ref 11.7–15.5)
MCH: 29.7 pg (ref 27.0–33.0)
MCHC: 33.5 g/dL (ref 32.0–36.0)
MCV: 88.5 fL (ref 80.0–100.0)
MPV: 10.8 fL (ref 7.5–12.5)
Monocytes Relative: 8 %
Neutro Abs: 2917 {cells}/uL (ref 1500–7800)
Neutrophils Relative %: 56.1 %
Platelets: 276 Thousand/uL (ref 140–400)
RBC: 4.89 Million/uL (ref 3.80–5.10)
RDW: 13 % (ref 11.0–15.0)
Total Lymphocyte: 33.8 %
WBC: 5.2 Thousand/uL (ref 3.8–10.8)

## 2023-11-07 LAB — HEMOGLOBIN A1C
Hgb A1c MFr Bld: 6.5 %{Hb} — ABNORMAL HIGH (ref <5.7)
Mean Plasma Glucose: 140 mg/dL
eAG (mmol/L): 7.7 mmol/L

## 2023-11-07 LAB — TSH: TSH: 3.33 m[IU]/L (ref 0.40–4.50)

## 2023-11-09 NOTE — Progress Notes (Signed)
Your labs are back Your A1c is 6.5%.  This puts you in the diabetic range.  We typically do not recommend starting treatment until someone is over 7.0%.  Please make sure that you are avoiding excess sugar and exercising regularly to help prevent further progression. Your cholesterol looks great Your electrolytes, liver and kidney function are overall in normal ranges CBC is overall normal, no signs of anemia Your thyroid testing is normal Please let us know if you have further questions or concerns

## 2023-12-04 ENCOUNTER — Other Ambulatory Visit: Payer: Self-pay | Admitting: Physician Assistant

## 2023-12-04 DIAGNOSIS — Z716 Tobacco abuse counseling: Secondary | ICD-10-CM

## 2023-12-04 DIAGNOSIS — Z720 Tobacco use: Secondary | ICD-10-CM

## 2023-12-07 NOTE — Telephone Encounter (Signed)
 Requested Prescriptions  Pending Prescriptions Disp Refills   buPROPion  (WELLBUTRIN  XL) 150 MG 24 hr tablet [Pharmacy Med Name: buPROPion  HCl ER (XL) 150 MG Oral Tablet Extended Release 24 Hour] 90 tablet 0    Sig: TAKE 1 TABLET BY MOUTH DAILY     Psychiatry: Antidepressants - bupropion  Passed - 12/07/2023  4:01 PM      Passed - Cr in normal range and within 360 days    Creat  Date Value Ref Range Status  11/06/2023 0.75 0.60 - 1.00 mg/dL Final         Passed - AST in normal range and within 360 days    AST  Date Value Ref Range Status  11/06/2023 12 10 - 35 U/L Final         Passed - ALT in normal range and within 360 days    ALT  Date Value Ref Range Status  11/06/2023 11 6 - 29 U/L Final         Passed - Last BP in normal range    BP Readings from Last 1 Encounters:  11/06/23 126/78         Passed - Valid encounter within last 6 months    Recent Outpatient Visits           1 month ago Hyperthyroidism   [facility] Mecum, Rocky BRAVO, PA-C   7 months ago Benign hypertension   [facility] Leavy Mole, PA-C   1 year ago Nodule of skin of abdomen   [facility] Leavy Mole, PA-C   1 year ago Benign hypertension   [facility] Leavy Mole, PA-C   1 year ago Problem of left ear   [facility] Madelon Donald HERO, DO       Future Appointments             In 5 months Mecum, Erin E, PA-C [facility], [facility]

## 2024-02-13 ENCOUNTER — Other Ambulatory Visit: Payer: Self-pay | Admitting: Physician Assistant

## 2024-02-13 DIAGNOSIS — Z716 Tobacco abuse counseling: Secondary | ICD-10-CM

## 2024-02-13 DIAGNOSIS — Z720 Tobacco use: Secondary | ICD-10-CM

## 2024-02-14 NOTE — Telephone Encounter (Signed)
 Requested Prescriptions  Pending Prescriptions Disp Refills   buPROPion (WELLBUTRIN XL) 150 MG 24 hr tablet [Pharmacy Med Name: buPROPion HCl ER (XL) 150 MG Oral Tablet Extended Release 24 Hour] 90 tablet 0    Sig: TAKE 1 TABLET BY MOUTH DAILY     Psychiatry: Antidepressants - bupropion Passed - 02/14/2024  8:21 AM      Passed - Cr in normal range and within 360 days    Creat  Date Value Ref Range Status  11/06/2023 0.75 0.60 - 1.00 mg/dL Final         Passed - AST in normal range and within 360 days    AST  Date Value Ref Range Status  11/06/2023 12 10 - 35 U/L Final         Passed - ALT in normal range and within 360 days    ALT  Date Value Ref Range Status  11/06/2023 11 6 - 29 U/L Final         Passed - Last BP in normal range    BP Readings from Last 1 Encounters:  11/06/23 126/78         Passed - Valid encounter within last 6 months    Recent Outpatient Visits           3 months ago Hyperthyroidism   [facility] Mecum, Oswaldo Conroy, PA-C   9 months ago Benign hypertension   [facility] Danelle Berry, PA-C   1 year ago Nodule of skin of abdomen   [facility] Danelle Berry, PA-C   1 year ago Benign hypertension   [facility] Danelle Berry, PA-C   1 year ago Problem of left ear   [facility] Caro Laroche, DO       Future Appointments             In 2 months Danelle Berry, PA-C [facility]

## 2024-03-11 ENCOUNTER — Other Ambulatory Visit: Payer: Self-pay | Admitting: Family Medicine

## 2024-03-11 DIAGNOSIS — I10 Essential (primary) hypertension: Secondary | ICD-10-CM

## 2024-03-12 NOTE — Telephone Encounter (Signed)
 Requested Prescriptions  Pending Prescriptions Disp Refills   amLODipine (NORVASC) 5 MG tablet [Pharmacy Med Name: amLODIPine Besylate 5 MG Oral Tablet] 100 tablet 0    Sig: TAKE 1 TABLET BY MOUTH DAILY     Cardiovascular: Calcium Channel Blockers 2 Failed - 03/12/2024  4:03 PM      Failed - Valid encounter within last 6 months    Recent Outpatient Visits   None     Future Appointments             In 1 month Danelle Berry, PA-C [facility]            Passed - Last BP in normal range    BP Readings from Last 1 Encounters:  11/06/23 126/78         Passed - Last Heart Rate in normal range    Pulse Readings from Last 1 Encounters:  11/06/23 94          valsartan-hydrochlorothiazide (DIOVAN-HCT) 160-25 MG tablet [Pharmacy Med Name: Valsartan-hydroCHLOROthiazide 160-25 MG Oral Tablet] 100 tablet 0    Sig: TAKE 1 TABLET BY MOUTH DAILY     Cardiovascular: ARB + Diuretic Combos Failed - 03/12/2024  4:03 PM      Failed - Valid encounter within last 6 months    Recent Outpatient Visits   None     Future Appointments             In 1 month Danelle Berry, PA-C [facility]            Passed - K in normal range and within 180 days    Potassium  Date Value Ref Range Status  11/06/2023 4.0 3.5 - 5.3 mmol/L Final  09/27/2013 3.2 (L) 3.5 - 5.1 mmol/L Final         Passed - Na in normal range and within 180 days    Sodium  Date Value Ref Range Status  11/06/2023 141 135 - 146 mmol/L Final  02/17/2020 143 134 - 144 mmol/L Final         Passed - Cr in normal range and within 180 days    Creat  Date Value Ref Range Status  11/06/2023 0.75 0.60 - 1.00 mg/dL Final         Passed - eGFR is 10 or above and within 180 days    GFR, Est African American  Date Value Ref Range Status  04/19/2021 109 > OR = 60 mL/min/1.21m2 Final   GFR, Est Non African American  Date Value Ref Range Status  04/19/2021 94 > OR = 60  mL/min/1.62m2 Final   eGFR  Date Value Ref Range Status  11/06/2023 85 > OR = 60 mL/min/1.43m2 Final         Passed - Patient is not pregnant      Passed - Last BP in normal range    BP Readings from Last 1 Encounters:  11/06/23 126/78

## 2024-04-04 ENCOUNTER — Ambulatory Visit: Payer: Self-pay

## 2024-04-04 DIAGNOSIS — Z0000 Encounter for general adult medical examination without abnormal findings: Secondary | ICD-10-CM

## 2024-04-04 NOTE — Patient Instructions (Addendum)
 Laura Kent , Thank you for taking time to come for your Medicare Wellness Visit. I appreciate your ongoing commitment to your health goals. Please review the following plan we discussed and let me know if I can assist you in the future.   Referrals/Orders/Follow-Ups/Clinician Recommendations: NONE  This is a list of the screening recommended for you and due dates:  Health Maintenance  Topic Date Due   Zoster (Shingles) Vaccine (2 of 2) 06/24/2022   COVID-19 Vaccine (7 - Pfizer risk 2024-25 season) 04/02/2024   Flu Shot  07/05/2024   Mammogram  10/05/2024   Medicare Annual Wellness Visit  04/04/2025   Colon Cancer Screening  01/17/2027   DEXA scan (bone density measurement)  04/14/2027   DTaP/Tdap/Td vaccine (3 - Td or Tdap) 02/24/2030   Pneumonia Vaccine  Completed   Hepatitis C Screening  Completed   HPV Vaccine  Aged Out   Meningitis B Vaccine  Aged Out    Advanced directives: (ACP Link)Information on Advanced Care Planning can be found at Therapist, music Advance Health Care Directives Advance Health Care Directives. [email or web address]   Next Medicare Annual Wellness Visit scheduled for next year: Yes  04/24/25 @ 3:50 PM BY PHONE  Have you seen your provider in the last 6 months (3 months if uncontrolled diabetes)? Yes

## 2024-04-04 NOTE — Progress Notes (Signed)
 Subjective:   Laura Kent is a 73 y.o. who presents for a Medicare Wellness preventive visit.  Visit Complete: Virtual I connected with  Laura Kent on 04/04/24 by a audio enabled telemedicine application and verified that I am speaking with the correct person using two identifiers.  Patient Location: Home  Provider Location: Office/Clinic  I discussed the limitations of evaluation and management by telemedicine. The patient expressed understanding and agreed to proceed.  Vital Signs: Because this visit was a virtual/telehealth visit, some criteria may be missing or patient reported. Any vitals not documented were not able to be obtained and vitals that have been documented are patient reported.  VideoDeclined- This patient declined Librarian, academic. Therefore the visit was completed with audio only.  Persons Participating in Visit: Patient.  AWV Questionnaire: No: Patient Medicare AWV questionnaire was not completed prior to this visit.  Cardiac Risk Factors include: advanced age (>19men, >73 women);hypertension;sedentary lifestyle;obesity (BMI >30kg/m2);smoking/ tobacco exposure     Objective:    There were no vitals filed for this visit. There is no height or weight on file to calculate BMI.     04/04/2024    3:56 PM 03/31/2023    2:31 PM 03/11/2021    3:03 PM 04/24/2017    9:05 AM 01/17/2017    1:13 PM 11/21/2016    8:43 AM 05/25/2016    4:16 PM  Advanced Directives  Does Patient Have a Medical Advance Directive? No No No No No No No  Would patient like information on creating a medical advance directive? No - Patient declined  Yes (MAU/Ambulatory/Procedural Areas - Information given)    No - patient declined information    Current Medications (verified) Outpatient Encounter Medications as of 04/04/2024  Medication Sig   amLODipine  (NORVASC ) 5 MG tablet TAKE 1 TABLET BY MOUTH DAILY   ascorbic acid (VITAMIN C) 500 MG tablet Take 500 mg  by mouth daily.   aspirin EC 81 MG tablet Take 81 mg by mouth daily.   buPROPion  (WELLBUTRIN  XL) 150 MG 24 hr tablet TAKE 1 TABLET BY MOUTH DAILY   cholecalciferol (VITAMIN D ) 1000 units tablet Take 1,000 Units by mouth daily as needed.   Turmeric 500 MG CAPS Take by mouth.   valsartan -hydrochlorothiazide  (DIOVAN -HCT) 160-25 MG tablet TAKE 1 TABLET BY MOUTH DAILY   No facility-administered encounter medications on file as of 04/04/2024.    Allergies (verified) Patient has no allergy information on record.   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   Obesity    Plantar fascial fibromatosis of left foot    Plantar fasciitis 04/21/2017   Reflux    Past Surgical History:  Procedure Laterality Date   BREAST CYST ASPIRATION Right 2011   BUNIONECTOMY     CHOLECYSTECTOMY  09/2013   COLONOSCOPY WITH PROPOFOL  N/A 01/17/2017   Procedure: COLONOSCOPY WITH PROPOFOL ;  Surgeon: Marshall Skeeter, MD;  Location: [facility] ENDOSCOPY;  Service: Endoscopy;  Laterality: N/A;   EYE SURGERY  Feb 2023   Cataracts   POSTERIOR TIBIAL TENDON REPAIR Left 10/26/2018   Family History  Problem Relation Age of Onset   Diabetes Mother    Kidney disease Mother    Heart disease  Mother    Hypertension Mother    Diabetes Father    Heart disease Father    Hypertension Father    Cancer Brother        leukemia   Heart disease Brother        tachycardia   Stroke Brother        fatal   Diabetes Sister    Colon cancer Maternal Aunt    Diabetes Maternal Grandmother    Diabetes Brother    Stroke Brother    Heart disease Brother    Heart disease Brother    Breast cancer Neg Hx    Social History   Socioeconomic History   Marital status: Married     Spouse name: Not on file   Number of children: Not on file   Years of education: Not on file   Highest education level: Bachelor's degree (e.g., BA, AB, BS)  Occupational History   Occupation: retired  Tobacco Use   Smoking status: Every Day    Current packs/day: 0.00    Average packs/day: 0.5 packs/day for 34.0 years (17.0 ttl pk-yrs)    Types: Cigarettes    Start date: 05/18/1978    Last attempt to quit: 05/18/2012    Years since quitting: 11.8   Smokeless tobacco: Never   Tobacco comments:    Quit 2013 started back 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  Vaping Use   Vaping status: Never Used  Substance and Sexual Activity   Alcohol use: No   Drug use: No   Sexual activity: Not Currently    Birth control/protection: None  Other Topics Concern   Not on file  Social History Narrative   Not on file   Social Drivers of Health   Financial Resource Strain: Low Risk  (04/04/2024)   Overall Financial Resource Strain (CARDIA)    Difficulty of Paying Living Expenses: Not hard at all  Food Insecurity: No Food Insecurity (04/04/2024)   Hunger Vital Sign    Worried About Running Out of Food in the Last Year: Never true    Ran Out of Food in the Last Year: Never true  Transportation Needs: No Transportation Needs (04/04/2024)   PRAPARE - Administrator, Civil Service (Medical): No    Lack of Transportation (Non-Medical): No  Physical Activity: Inactive (04/04/2024)   Exercise Vital Sign    Days of Exercise per Week: 0 days    Minutes of Exercise per Session: 0 min  Stress: No Stress Concern Present (04/04/2024)   [employer] of Occupational Health - Occupational Stress Questionnaire    Feeling of Stress : Not at all  Social Connections: Moderately Integrated (04/04/2024)   Social Connection and Isolation Panel [NHANES]    Frequency of Communication with Friends and Family: Three times a week    Frequency of Social Gatherings with Friends and Family: Never    Attends Religious Services: 1 to 4  times per year    Active Member of [employer] or Organizations: No    Attends Banker Meetings: Never    Marital Status: Married    Tobacco Counseling Ready to quit: Not Answered Counseling given: Not Answered Tobacco comments: Quit 2013 started back 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    Clinical Intake:  Pre-visit preparation completed: Yes  Pain : No/denies pain     BMI - recorded: 36.3 Nutritional Status: BMI > 30  Obese Nutritional Risks: None Diabetes: No  Lab Results  Component Value Date   HGBA1C 6.5 (H)  11/06/2023   HGBA1C 6.4 (H) 05/03/2023   HGBA1C 6.1 (H) 04/21/2022     How often do you need to have someone help you when you read instructions, pamphlets, or other written materials from your doctor or pharmacy?: 1 - Never  Interpreter Needed?: No  Information entered by :: Dellie Fergusson, LPN   Activities of Daily Living    04/04/2024    3:58 PM 04/03/2024    9:53 PM  In your present state of health, do you have any difficulty performing the following activities:  Hearing? 0 0  Vision? 0 0  Difficulty concentrating or making decisions? 0 0  Walking or climbing stairs? 0 0  Dressing or bathing? 0 0  Doing errands, shopping? 0 0  Preparing Food and eating ? N N  Using the Toilet? N N  In the past six months, have you accidently leaked urine? N N  Do you have problems with loss of bowel control? N N  Managing your Medications? N N  Managing your Finances? N N  Housekeeping or managing your Housekeeping? N N    Patient Care Team: Adeline Hone, PA-C as PCP - General (Family Medicine) [facility])  Indicate any recent Medical Services you may have received from other than [facility] providers in the past year (date may be approximate).     Assessment:   This is a routine wellness examination for Laura Kent.  Hearing/Vision screen Hearing Screening - Comments:: NO AIDS Vision Screening - Comments:: [facility]- DR.THURMOND- HAD APPT IN NOVEMBER    Goals Addressed             This Visit's Progress    DIET - EAT MORE FRUITS AND VEGETABLES         Depression Screen     04/04/2024    3:55 PM 11/06/2023   10:52 AM 05/03/2023    8:54 AM 03/31/2023    2:31 PM 09/22/2022    9:24 AM 04/21/2022    8:29 AM 03/22/2022    3:11 PM  PHQ 2/9 Scores  PHQ - 2 Score 0 0 0 0 0 0 0  PHQ- 9 Score 0 0 0  0 0     Fall Risk     04/04/2024    3:57 PM 04/03/2024    9:53 PM 11/06/2023   10:52 AM 05/03/2023    8:54 AM 03/28/2023    9:40 AM  Fall Risk   Falls in the past year? 0 0 0 0 0  Number falls in past yr: 0  0 0 0  Injury with Fall? 0  0 0 0  Risk for fall due to : No Fall Risks  No Fall Risks No Fall Risks Medication side effect  Follow up Falls prevention discussed;Falls evaluation completed  Falls prevention discussed Falls prevention discussed;Education provided;Falls evaluation completed Falls prevention discussed;Education provided;Falls evaluation completed    MEDICARE RISK AT HOME:  Medicare Risk at Home Any stairs in or around the home?: Yes If so, are there any without handrails?: Yes Home free of loose throw rugs in walkways, pet beds, electrical cords, etc?: Yes Adequate lighting in your home to reduce risk of falls?: Yes Life alert?: No Use of a cane, walker or w/c?: Yes (CANE IF UNSTEADY (ONLY OCCASIONALLY)) Grab bars in the bathroom?: Yes Shower chair or bench in shower?: No Elevated toilet seat or a handicapped toilet?: Yes  TIMED UP AND GO:  Was the test performed?  No  Cognitive Function: 6CIT completed  04/04/2024    4:00 PM 03/31/2023    2:32 PM  6CIT Screen  What Year? 0 points 0 points  What month? 0 points 0 points  What time? 0 points 0 points  Count back from 20 0 points 0 points  Months in reverse 0 points 0 points  Repeat phrase 0 points 0 points  Total Score 0 points 0 points    Immunizations Immunization History  Administered Date(s) Administered   Fluad Quad(high Dose 65+) 11/04/2020    Influenza, High Dose Seasonal PF 09/24/2018, 10/04/2021, 11/02/2022, 10/03/2023   Influenza,inj,Quad PF,6+ Mos 10/04/2019   Influenza-Unspecified 08/12/2014, 08/06/2015, 09/24/2018   PFIZER(Purple Top)SARS-COV-2 Vaccination 01/15/2020, 02/05/2020, 09/29/2020   Pfizer Covid-19 Vaccine Bivalent Booster 7yrs & up 10/04/2021   Pfizer(Comirnaty)Fall Seasonal Vaccine 12 years and older 11/02/2022, 10/03/2023   Pneumococcal Conjugate-13 10/16/2018   Pneumococcal Polysaccharide-23 08/24/2012, 02/25/2020   Td 08/05/2009   Tdap 02/25/2020   Zoster Recombinant(Shingrix) 04/29/2022   Zoster, Live 05/19/2015    Screening Tests Health Maintenance  Topic Date Due   Zoster Vaccines- Shingrix (2 of 2) 06/24/2022   COVID-19 Vaccine (7 - Pfizer risk 2024-25 season) 04/02/2024   INFLUENZA VACCINE  07/05/2024   MAMMOGRAM  10/05/2024   Medicare Annual Wellness (AWV)  04/04/2025   Colonoscopy  01/17/2027   DEXA SCAN  04/14/2027   DTaP/Tdap/Td (3 - Td or Tdap) 02/24/2030   Pneumonia Vaccine 49+ Years old  Completed   Hepatitis C Screening  Completed   HPV VACCINES  Aged Out   Meningococcal B Vaccine  Aged Out    Health Maintenance  Health Maintenance Due  Topic Date Due   Zoster Vaccines- Shingrix (2 of 2) 06/24/2022   COVID-19 Vaccine (7 - Pfizer risk 2024-25 season) 04/02/2024   Health Maintenance Items Addressed: UP TO DATE W/ SHOTS, EXCEPT 2ND SHINGRIX; UP TO DATE ON MAMMOGRAM, BONE DENSITY & COLONOSCOPY  Additional Screening:  Vision Screening: Recommended annual ophthalmology exams for early detection of glaucoma and other disorders of the eye.  Dental Screening: Recommended annual dental exams for proper oral hygiene  Community Resource Referral / Chronic Care Management: CRR required this visit?  No   CCM required this visit?  No     Plan:     I have personally reviewed and noted the following in the patient's chart:   Medical and social history Use of alcohol, tobacco or  illicit drugs  Current medications and supplements including opioid prescriptions. Patient is not currently taking opioid prescriptions. Functional ability and status Nutritional status Physical activity Advanced directives List of other physicians Hospitalizations, surgeries, and ER visits in previous 12 months Vitals Screenings to include cognitive, depression, and falls Referrals and appointments  In addition, I have reviewed and discussed with patient certain preventive protocols, quality metrics, and best practice recommendations. A written personalized care plan for preventive services as well as general preventive health recommendations were provided to patient.     Laura Bright, LPN   05/09/7845   After Visit Summary: (MyChart) Due to this being a telephonic visit, the after visit summary with patients personalized plan was offered to patient via MyChart   Notes: Nothing significant to report at this time.

## 2024-04-25 ENCOUNTER — Other Ambulatory Visit: Payer: Self-pay | Admitting: Family Medicine

## 2024-04-25 DIAGNOSIS — Z716 Tobacco abuse counseling: Secondary | ICD-10-CM

## 2024-04-25 DIAGNOSIS — Z720 Tobacco use: Secondary | ICD-10-CM

## 2024-04-26 NOTE — Telephone Encounter (Signed)
 Requested Prescriptions  Pending Prescriptions Disp Refills   buPROPion  (WELLBUTRIN  XL) 150 MG 24 hr tablet [Pharmacy Med Name: buPROPion  HCl ER (XL) 150 MG Oral Tablet Extended Release 24 Hour] 90 tablet 0    Sig: TAKE 1 TABLET BY MOUTH DAILY     Psychiatry: Antidepressants - bupropion  Passed - 04/26/2024 10:36 AM      Passed - Cr in normal range and within 360 days    Creat  Date Value Ref Range Status  11/06/2023 0.75 0.60 - 1.00 mg/dL Final         Passed - AST in normal range and within 360 days    AST  Date Value Ref Range Status  11/06/2023 12 10 - 35 U/L Final         Passed - ALT in normal range and within 360 days    ALT  Date Value Ref Range Status  11/06/2023 11 6 - 29 U/L Final         Passed - Last BP in normal range    BP Readings from Last 1 Encounters:  11/06/23 126/78         Passed - Valid encounter within last 6 months    Recent Outpatient Visits   None     Future Appointments             In 1 week Adeline Hone, PA-C [facility], [facility]

## 2024-05-07 ENCOUNTER — Ambulatory Visit: Payer: Medicare Other | Admitting: Physician Assistant

## 2024-05-08 ENCOUNTER — Encounter: Payer: Self-pay | Admitting: Family Medicine

## 2024-05-08 ENCOUNTER — Ambulatory Visit (INDEPENDENT_AMBULATORY_CARE_PROVIDER_SITE_OTHER): Payer: Medicare Other | Admitting: Family Medicine

## 2024-05-08 VITALS — BP 126/70 | HR 91 | Resp 16 | Ht 65.0 in | Wt 221.0 lb

## 2024-05-08 DIAGNOSIS — E66812 Obesity, class 2: Secondary | ICD-10-CM

## 2024-05-08 DIAGNOSIS — Z720 Tobacco use: Secondary | ICD-10-CM

## 2024-05-08 DIAGNOSIS — E785 Hyperlipidemia, unspecified: Secondary | ICD-10-CM

## 2024-05-08 DIAGNOSIS — I10 Essential (primary) hypertension: Secondary | ICD-10-CM

## 2024-05-08 DIAGNOSIS — F17210 Nicotine dependence, cigarettes, uncomplicated: Secondary | ICD-10-CM

## 2024-05-08 DIAGNOSIS — E0590 Thyrotoxicosis, unspecified without thyrotoxic crisis or storm: Secondary | ICD-10-CM

## 2024-05-08 DIAGNOSIS — E119 Type 2 diabetes mellitus without complications: Secondary | ICD-10-CM

## 2024-05-08 DIAGNOSIS — R7303 Prediabetes: Secondary | ICD-10-CM

## 2024-05-08 DIAGNOSIS — Z6836 Body mass index (BMI) 36.0-36.9, adult: Secondary | ICD-10-CM

## 2024-05-08 HISTORY — DX: Type 2 diabetes mellitus without complications: E11.9

## 2024-05-08 MED ORDER — AMLODIPINE BESYLATE 5 MG PO TABS: 5.0000 mg | ORAL_TABLET | Freq: Every day | ORAL | 1 refills | Status: DC

## 2024-05-08 MED ORDER — ROSUVASTATIN CALCIUM 5 MG PO TABS: 5.0000 mg | ORAL_TABLET | Freq: Every day | ORAL | 3 refills | Status: AC

## 2024-05-08 MED ORDER — VALSARTAN-HYDROCHLOROTHIAZIDE 160-25 MG PO TABS: 1.0000 | ORAL_TABLET | Freq: Every day | ORAL | 1 refills | Status: DC

## 2024-05-08 NOTE — Progress Notes (Signed)
 Name: Laura Kent   MRN: [id number]    DOB: 12/08/1950   Date:05/08/2024       Progress Note  Chief Complaint  Patient presents with   Medicare Wellness   Hypertension   Prediabetes    Subjective:   Laura Kent is a 73 y.o. female, presents to clinic for routine follow up on chronic conditions  New DM dx with last labs, done by other provider, pt did not know she had T2dm, discussed change in standard of care, DM foot exam etc today She is not on meds, discussed metformin  and ozempic Also discussed statin and she is already on valsartan   Lab Results  Component Value Date   HGBA1C 6.5 (H) 11/06/2023   HGBA1C 6.4 (H) 05/03/2023   HGBA1C 6.1 (H) 04/21/2022   HGBA1C 6.1 (H) 04/19/2021   HGBA1C 6.3 (H) 02/17/2020   Prior to DM she had high ASCVD and reviewed today with DM risk will increase, she is willing to try crestor , she was prescribed it in the past but never actually took it  HTN on amlodipine  and valsartan -HCTZ, bp well controlled, good med compliance no SE or concerns BP Readings from Last 3 Encounters:  05/08/24 126/70  11/06/23 126/78  05/25/23 (!) 144/82   Smoking - smoking 1/2 ppd, wellbutrin  helped with smoking cravings in the am but not evening Smoking cessation instruction/counseling given:  counseled patient on the dangers of tobacco use, advised patient to stop smoking, and reviewed strategies to maximize success More than 5 min spent today tobacco counseling      Current Outpatient Medications:    amLODipine  (NORVASC ) 5 MG tablet, TAKE 1 TABLET BY MOUTH DAILY, Disp: 100 tablet, Rfl: 0   ascorbic acid (VITAMIN C) 500 MG tablet, Take 500 mg by mouth daily., Disp: , Rfl:    aspirin EC 81 MG tablet, Take 81 mg by mouth daily., Disp: , Rfl:    buPROPion  (WELLBUTRIN  XL) 150 MG 24 hr tablet, TAKE 1 TABLET BY MOUTH DAILY, Disp: 90 tablet, Rfl: 0   cholecalciferol (VITAMIN D ) 1000 units tablet, Take 1,000 Units by mouth daily as needed., Disp: , Rfl:     Turmeric 500 MG CAPS, Take by mouth., Disp: , Rfl:    valsartan -hydrochlorothiazide  (DIOVAN -HCT) 160-25 MG tablet, TAKE 1 TABLET BY MOUTH DAILY, Disp: 100 tablet, Rfl: 0  Patient Active Problem List   Diagnosis Date Noted   Prediabetes 08/19/2019   Tobacco abuse 11/24/2017   Hyperthyroidism 04/30/2017   Benign hypertension 05/19/2015   Class 2 severe obesity with serious comorbidity and body mass index (BMI) of 36.0 to 36.9 in adult [facility]) 05/19/2015    Past Surgical History:  Procedure Laterality Date   BREAST CYST ASPIRATION Right 2011   BUNIONECTOMY     CHOLECYSTECTOMY  09/2013   COLONOSCOPY WITH PROPOFOL  N/A 01/17/2017   Procedure: COLONOSCOPY WITH PROPOFOL ;  Surgeon: Marshall Skeeter, MD;  Location: [facility] ENDOSCOPY;  Service: Endoscopy;  Laterality: N/A;   EYE SURGERY  Feb 2023   Cataracts   POSTERIOR TIBIAL TENDON REPAIR Left 10/26/2018    Family History  Problem Relation Age of Onset   Diabetes Mother    Kidney disease Mother    Heart disease Mother    Hypertension Mother    Diabetes Father    Heart disease Father    Hypertension Father    Cancer Brother        leukemia   Heart disease Brother  tachycardia   Stroke Brother        fatal   Diabetes Sister    Colon cancer Maternal Aunt    Diabetes Maternal Grandmother    Diabetes Brother    Stroke Brother    Heart disease Brother    Heart disease Brother    Breast cancer Neg Hx     Social History   Tobacco Use   Smoking status: Every Day    Current packs/day: 0.00    Average packs/day: 0.5 packs/day for 34.0 years (17.0 ttl pk-yrs)    Types: Cigarettes    Start date: 05/18/1978    Last attempt to quit: 05/18/2012    Years since quitting: 11.9   Smokeless tobacco: Never   Tobacco comments:    Quit 2013 started back 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  Vaping Use   Vaping status: Never Used  Substance Use Topics   Alcohol use: No   Drug use: No     No Known Allergies  Health Maintenance  Topic Date Due    COVID-19 Vaccine (7 - Pfizer risk 2024-25 season) 05/23/2024 (Originally 04/02/2024)   Zoster Vaccines- Shingrix (2 of 2) 08/07/2024 (Originally 06/24/2022)   INFLUENZA VACCINE  07/05/2024   MAMMOGRAM  10/05/2024   Medicare Annual Wellness (AWV)  04/04/2025   Colonoscopy  01/17/2027   DEXA SCAN  04/14/2027   DTaP/Tdap/Td (3 - Td or Tdap) 02/24/2030   Pneumonia Vaccine 70+ Years old  Completed   Hepatitis C Screening  Completed   HPV VACCINES  Aged Out   Meningococcal B Vaccine  Aged Out    Chart Review Today: I personally reviewed active problem list, medication list, allergies, family history, social history, health maintenance, notes from last encounter, lab results, imaging with the patient/caregiver today.   Review of Systems  Constitutional: Negative.   HENT: Negative.    Eyes: Negative.   Respiratory: Negative.    Cardiovascular: Negative.   Gastrointestinal: Negative.   Endocrine: Negative.   Genitourinary: Negative.   Musculoskeletal: Negative.   Skin: Negative.   Allergic/Immunologic: Negative.   Neurological: Negative.   Hematological: Negative.   Psychiatric/Behavioral: Negative.    All other systems reviewed and are negative.    Objective:   Vitals:   05/08/24 1055  BP: 126/70  Pulse: 91  Resp: 16  SpO2: 98%  Weight: 221 lb (100.2 kg)  Height: 5\' 5"  (1.651 m)    Body mass index is 36.78 kg/m.  Physical Exam Vitals and nursing note reviewed.  Constitutional:      General: She is not in acute distress.    Appearance: Normal appearance. She is well-developed. She is obese. She is not ill-appearing, toxic-appearing or diaphoretic.  HENT:     Head: Normocephalic and atraumatic.     Right Ear: External ear normal.     Left Ear: External ear normal.     Nose: Nose normal.  Eyes:     General: No scleral icterus.       Right eye: No discharge.        Left eye: No discharge.     Conjunctiva/sclera: Conjunctivae normal.  Neck:     Trachea: No tracheal  deviation.  Cardiovascular:     Rate and Rhythm: Normal rate.     Pulses: Normal pulses.     Heart sounds: Normal heart sounds.  Pulmonary:     Effort: Pulmonary effort is normal. No respiratory distress.     Breath sounds: Normal breath sounds. No stridor.  Musculoskeletal:  Right lower leg: No edema.     Left lower leg: No edema.  Skin:    General: Skin is warm and dry.     Findings: No rash.  Neurological:     Mental Status: She is alert.     Motor: No abnormal muscle tone.     Coordination: Coordination normal.     Gait: Gait normal.  Psychiatric:        Mood and Affect: Mood normal.        Behavior: Behavior normal.     Diabetic Foot Exam - Simple   Simple Foot Form Diabetic Foot exam was performed with the following findings: Yes 05/08/2024 11:50 AM  Visual Inspection No deformities, no ulcerations, no other skin breakdown bilaterally: Yes Sensation Testing Intact to touch and monofilament testing bilaterally: Yes Pulse Check Posterior Tibialis and Dorsalis pulse intact bilaterally: Yes Comments       Results for orders placed or performed in visit on 11/06/23  HgB A1c   Collection Time: 11/06/23 11:48 AM  Result Value Ref Range   Hgb A1c MFr Bld 6.5 (H) <5.7 % of total Hgb   Mean Plasma Glucose 140 mg/dL   eAG (mmol/L) 7.7 mmol/L  COMPLETE METABOLIC PANEL WITH GFR   Collection Time: 11/06/23 11:48 AM  Result Value Ref Range   Glucose, Bld 95 65 - 99 mg/dL   BUN 13 7 - 25 mg/dL   Creat 7.84 6.96 - 2.95 mg/dL   eGFR 85 > OR = 60 MW/UXL/2.44W1   BUN/Creatinine Ratio SEE NOTE: 6 - 22 (calc)   Sodium 141 135 - 146 mmol/L   Potassium 4.0 3.5 - 5.3 mmol/L   Chloride 104 98 - 110 mmol/L   CO2 31 20 - 32 mmol/L   Calcium  10.4 8.6 - 10.4 mg/dL   Total Protein 6.9 6.1 - 8.1 g/dL   Albumin 4.0 3.6 - 5.1 g/dL   Globulin 2.9 1.9 - 3.7 g/dL (calc)   AG Ratio 1.4 1.0 - 2.5 (calc)   Total Bilirubin 0.4 0.2 - 1.2 mg/dL   Alkaline phosphatase (APISO) 74 37 - 153  U/L   AST 12 10 - 35 U/L   ALT 11 6 - 29 U/L  CBC w/Diff/Platelet   Collection Time: 11/06/23 11:48 AM  Result Value Ref Range   WBC 5.2 3.8 - 10.8 Thousand/uL   RBC 4.89 3.80 - 5.10 Million/uL   Hemoglobin 14.5 11.7 - 15.5 g/dL   HCT 02.7 25.3 - 66.4 %   MCV 88.5 80.0 - 100.0 fL   MCH 29.7 27.0 - 33.0 pg   MCHC 33.5 32.0 - 36.0 g/dL   RDW 40.3 47.4 - 25.9 %   Platelets 276 140 - 400 Thousand/uL   MPV 10.8 7.5 - 12.5 fL   Neutro Abs 2,917 1,500 - 7,800 cells/uL   Absolute Lymphocytes 1,758 850 - 3,900 cells/uL   Absolute Monocytes 416 200 - 950 cells/uL   Eosinophils Absolute 68 15 - 500 cells/uL   Basophils Absolute 42 0 - 200 cells/uL   Neutrophils Relative % 56.1 %   Total Lymphocyte 33.8 %   Monocytes Relative 8.0 %   Eosinophils Relative 1.3 %   Basophils Relative 0.8 %  Lipid Profile   Collection Time: 11/06/23 11:48 AM  Result Value Ref Range   Cholesterol 120 <200 mg/dL   HDL 49 (L) > OR = 50 mg/dL   Triglycerides 74 <563 mg/dL   LDL Cholesterol (Calc) 56 mg/dL (calc)   Total  CHOL/HDL Ratio 2.4 <5.0 (calc)   Non-HDL Cholesterol (Calc) 71 <433 mg/dL (calc)  TSH   Collection Time: 11/06/23 11:48 AM  Result Value Ref Range   TSH 3.33 0.40 - 4.50 mIU/L  T4   Collection Time: 11/06/23 11:48 AM  Result Value Ref Range   T4, Total 8.2 5.1 - 11.9 mcg/dL      Assessment & Plan:   Hyperthyroidism Assessment & Plan: Reviewed last labs for monitoring, will not do today, annually monitoring is adequate while euthyroid and asx Lab Results  Component Value Date   TSH 3.33 11/06/2023     Benign hypertension Assessment & Plan: HTN well controlled and BP at goal today with current meds and diet/lifestyle Good med compliance no SE or concerns On amlodipine  and valsartan -HCTZ BP Readings from Last 3 Encounters:  05/08/24 126/70  11/06/23 126/78  05/25/23 (!) 144/82  No med changes, refills ordered F/up 6 months, CMP today to monitor renal function and  electrolytes with meds   Orders: -     Valsartan -hydroCHLOROthiazide ; Take 1 tablet by mouth daily.  Dispense: 100 tablet; Refill: 1 -     amLODIPine  Besylate; Take 1 tablet (5 mg total) by mouth daily.  Dispense: 100 tablet; Refill: 1  Class 2 severe obesity due to excess calories with serious comorbidity and body mass index (BMI) of 36.0 to 36.9 in adult [facility]) Assessment & Plan: Associated comorbidities DM (new onset) HLD HTN, discussed healthy diet/lifestyle habits and ozempic    Tobacco abuse Assessment & Plan: Smoking cessation counseling done today Declined patches Discussed dose adjustment options for wellbutrin  Updated smoking hx and pack year hx   Type 2 diabetes mellitus without complication, without long-term current use of insulin (HCC) Assessment & Plan: New onset Spoke with pt about new diagnosis.  Discussed A1C results with them and explained what an A1C is, basic pathophysiology of DM Type 2, basic home care, basic diabetes diet nutrition principles, importance of checking CBGs and maintaining good CBG control to prevent long-term and short-term complications.  Reviewed signs and symptoms of hyperglycemia and hypoglycemia and how to treat hypoglycemia at home.  Also reviewed blood sugar goals and A1c goals for home.   Standards of care also reviewed, she is on ARB, urged her to start statin, DM foot exam done, additional kidney testing UACR done, needs DM eye exam  Have also placed RD consult for DM diet education for this patient.    Lab Results  Component Value Date   HGBA1C 6.5 (H) 11/06/2023   HGBA1C 6.4 (H) 05/03/2023   HGBA1C 6.1 (H) 04/21/2022   HGBA1C 6.1 (H) 04/19/2021   HGBA1C 6.3 (H) 02/17/2020   Discussed metformin  and ozempic - she wishes to not start any medications now unless needed for uncontrolled A1c  Orders: -     Comprehensive metabolic panel with GFR -     Hemoglobin A1c -     Microalbumin / creatinine urine ratio -     Rosuvastatin   Calcium ; Take 1 tablet (5 mg total) by mouth daily.  Dispense: 90 tablet; Refill: 3 -     Referral to Nutrition and Diabetes Services  Hyperlipidemia, unspecified hyperlipidemia type -     Rosuvastatin  Calcium ; Take 1 tablet (5 mg total) by mouth daily.  Dispense: 90 tablet; Refill: 3  Benign hypertension Assessment & Plan: HTN well controlled and BP at goal today with current meds and diet/lifestyle Good med compliance no SE or concerns On amlodipine  and valsartan -HCTZ BP Readings from Last  3 Encounters:  05/08/24 126/70  11/06/23 126/78  05/25/23 (!) 144/82  No med changes, refills ordered F/up 6 months, CMP today to monitor renal function and electrolytes with meds   Orders: -     Valsartan -hydroCHLOROthiazide ; Take 1 tablet by mouth daily.  Dispense: 100 tablet; Refill: 1 -     amLODIPine  Besylate; Take 1 tablet (5 mg total) by mouth daily.  Dispense: 100 tablet; Refill: 1   [facility] [place], [place]   Return in about 6 months (around 11/07/2024) for HTN, Prediabetes.   Adeline Hone, PA-C 05/08/24 11:14 AM

## 2024-05-08 NOTE — Assessment & Plan Note (Signed)
 Associated comorbidities DM (new onset) HLD HTN, discussed healthy diet/lifestyle habits and ozempic

## 2024-05-08 NOTE — Assessment & Plan Note (Signed)
 Smoking cessation counseling done today Declined patches Discussed dose adjustment options for wellbutrin  Updated smoking hx and pack year hx

## 2024-05-08 NOTE — Assessment & Plan Note (Signed)
 Reviewed last labs for monitoring, will not do today, annually monitoring is adequate while euthyroid and asx Lab Results  Component Value Date   TSH 3.33 11/06/2023

## 2024-05-08 NOTE — Assessment & Plan Note (Signed)
 New onset Spoke with pt about new diagnosis.  Discussed A1C results with them and explained what an A1C is, basic pathophysiology of DM Type 2, basic home care, basic diabetes diet nutrition principles, importance of checking CBGs and maintaining good CBG control to prevent long-term and short-term complications.  Reviewed signs and symptoms of hyperglycemia and hypoglycemia and how to treat hypoglycemia at home.  Also reviewed blood sugar goals and A1c goals for home.   Standards of care also reviewed, she is on ARB, urged her to start statin, DM foot exam done, additional kidney testing UACR done, needs DM eye exam  Have also placed RD consult for DM diet education for this patient.    Lab Results  Component Value Date   HGBA1C 6.5 (H) 11/06/2023   HGBA1C 6.4 (H) 05/03/2023   HGBA1C 6.1 (H) 04/21/2022   HGBA1C 6.1 (H) 04/19/2021   HGBA1C 6.3 (H) 02/17/2020   Discussed metformin  and ozempic - she wishes to not start any medications now unless needed for uncontrolled A1c

## 2024-05-08 NOTE — Assessment & Plan Note (Signed)
 HTN well controlled and BP at goal today with current meds and diet/lifestyle Good med compliance no SE or concerns On amlodipine  and valsartan -HCTZ BP Readings from Last 3 Encounters:  05/08/24 126/70  11/06/23 126/78  05/25/23 (!) 144/82  No med changes, refills ordered F/up 6 months, CMP today to monitor renal function and electrolytes with meds

## 2024-05-08 NOTE — Patient Instructions (Addendum)
 Try taking 300 mg of buproprion (take 2 tabs) in the morning and see if that helps decrease your urge to smoke  We could also try the short acting form which you would take morning and lunchtime   Let me know in a few weeks if you had success with 300 mg once in the morning or not, or if you want to try changing to the short acting form - that way I can change your prescriptions  Call your eye doctor for a diabetic eye exam  Try the cholesterol medications - crestor  once a day   Health Maintenance  Topic Date Due   Complete foot exam   Never done   Eye exam for diabetics  Never done   Yearly kidney health urinalysis for diabetes  Never done   Hemoglobin A1C  05/06/2024   COVID-19 Vaccine (7 - Pfizer risk 2024-25 season) 05/23/2024*   Zoster (Shingles) Vaccine (2 of 2) 08/07/2024*   Flu Shot  07/05/2024   Mammogram  10/05/2024   Yearly kidney function blood test for diabetes  11/05/2024   Medicare Annual Wellness Visit  04/04/2025   Colon Cancer Screening  01/17/2027   DEXA scan (bone density measurement)  04/14/2027   DTaP/Tdap/Td vaccine (3 - Td or Tdap) 02/24/2030   Pneumonia Vaccine  Completed   Hepatitis C Screening  Completed   HPV Vaccine  Aged Out   Meningitis B Vaccine  Aged Out  *Topic was postponed. The date shown is not the original due date.

## 2024-05-09 ENCOUNTER — Ambulatory Visit: Payer: Self-pay | Admitting: Family Medicine

## 2024-05-09 DIAGNOSIS — R809 Proteinuria, unspecified: Secondary | ICD-10-CM

## 2024-05-09 DIAGNOSIS — E119 Type 2 diabetes mellitus without complications: Secondary | ICD-10-CM

## 2024-05-09 LAB — COMPREHENSIVE METABOLIC PANEL WITH GFR
AG Ratio: 1.4 (calc) (ref 1.0–2.5)
ALT: 13 U/L (ref 6–29)
AST: 13 U/L (ref 10–35)
Albumin: 4 g/dL (ref 3.6–5.1)
Alkaline phosphatase (APISO): 93 U/L (ref 37–153)
BUN: 11 mg/dL (ref 7–25)
CO2: 28 mmol/L (ref 20–32)
Calcium: 10.4 mg/dL (ref 8.6–10.4)
Chloride: 105 mmol/L (ref 98–110)
Creat: 0.75 mg/dL (ref 0.60–1.00)
Globulin: 2.8 g/dL (ref 1.9–3.7)
Glucose, Bld: 85 mg/dL (ref 65–99)
Potassium: 4.6 mmol/L (ref 3.5–5.3)
Sodium: 141 mmol/L (ref 135–146)
Total Bilirubin: 0.5 mg/dL (ref 0.2–1.2)
Total Protein: 6.8 g/dL (ref 6.1–8.1)
eGFR: 85 mL/min/{1.73_m2} (ref >=60)

## 2024-05-09 LAB — HEMOGLOBIN A1C
Hgb A1c MFr Bld: 6.5 % — ABNORMAL HIGH (ref <5.7)
Mean Plasma Glucose: 140 mg/dL
eAG (mmol/L): 7.7 mmol/L

## 2024-05-09 LAB — MICROALBUMIN / CREATININE URINE RATIO
Creatinine, Urine: 123 mg/dL (ref 20–275)
Microalb Creat Ratio: 66 mg/g{creat} — ABNORMAL HIGH (ref <30)
Microalb, Ur: 8.1 mg/dL

## 2024-06-13 ENCOUNTER — Ambulatory Visit

## 2024-06-20 ENCOUNTER — Ambulatory Visit

## 2024-06-27 ENCOUNTER — Ambulatory Visit

## 2024-07-06 ENCOUNTER — Other Ambulatory Visit: Payer: Self-pay | Admitting: Family Medicine

## 2024-07-06 DIAGNOSIS — Z720 Tobacco use: Secondary | ICD-10-CM

## 2024-07-06 DIAGNOSIS — Z716 Tobacco abuse counseling: Secondary | ICD-10-CM

## 2024-07-08 NOTE — Telephone Encounter (Signed)
 Requested Prescriptions  Pending Prescriptions Disp Refills   buPROPion  (WELLBUTRIN  XL) 150 MG 24 hr tablet [Pharmacy Med Name: buPROPion  HCl ER (XL) 150 MG Oral Tablet Extended Release 24 Hour] 90 tablet 1    Sig: TAKE 1 TABLET BY MOUTH DAILY     Psychiatry: Antidepressants - bupropion  Passed - 07/08/2024  2:03 PM      Passed - Cr in normal range and within 360 days    Creat  Date Value Ref Range Status  05/08/2024 0.75 0.60 - 1.00 mg/dL Final   Creatinine, Urine  Date Value Ref Range Status  05/08/2024 123 20 - 275 mg/dL Final         Passed - AST in normal range and within 360 days    AST  Date Value Ref Range Status  05/08/2024 13 10 - 35 U/L Final         Passed - ALT in normal range and within 360 days    ALT  Date Value Ref Range Status  05/08/2024 13 6 - 29 U/L Final         Passed - Last BP in normal range    BP Readings from Last 1 Encounters:  05/08/24 126/70         Passed - Valid encounter within last 6 months    Recent Outpatient Visits           2 months ago Hyperthyroidism   [facility] Leavy Mole, PA-C       Future Appointments             In 1 month Leavy Mole, PA-C [facility]   In 4 months Leavy Mole, PA-C [facility]

## 2024-08-08 ENCOUNTER — Ambulatory Visit: Admitting: Family Medicine

## 2024-08-15 ENCOUNTER — Ambulatory Visit

## 2024-08-22 ENCOUNTER — Ambulatory Visit

## 2024-08-29 ENCOUNTER — Ambulatory Visit

## 2024-09-19 ENCOUNTER — Other Ambulatory Visit: Payer: Self-pay | Admitting: Family Medicine

## 2024-09-19 DIAGNOSIS — Z1231 Encounter for screening mammogram for malignant neoplasm of breast: Secondary | ICD-10-CM

## 2024-10-22 ENCOUNTER — Ambulatory Visit
Admission: RE | Admit: 2024-10-22 | Discharge: 2024-10-22 | Disposition: A | Discharge status: Home / Self Care | Source: Ambulatory Visit | Attending: Family Medicine | Admitting: Family Medicine

## 2024-10-22 DIAGNOSIS — Z1231 Encounter for screening mammogram for malignant neoplasm of breast: Secondary | ICD-10-CM | POA: Insufficient documentation

## 2024-11-11 ENCOUNTER — Encounter: Admitting: Family Medicine

## 2024-11-20 ENCOUNTER — Telehealth: Payer: Self-pay

## 2024-11-20 NOTE — Telephone Encounter (Signed)
 Refill request for amLODipine  (NORVASC ) 5 MG tablet

## 2024-11-20 NOTE — Telephone Encounter (Signed)
 Lvm for appt to be scheduled

## 2024-12-27 ENCOUNTER — Encounter: Payer: Self-pay | Admitting: Family Medicine

## 2024-12-27 ENCOUNTER — Ambulatory Visit: Admitting: Family Medicine

## 2024-12-27 VITALS — BP 122/78 | HR 89 | Resp 16 | Ht 65.0 in | Wt 226.0 lb

## 2024-12-27 DIAGNOSIS — Z716 Tobacco abuse counseling: Secondary | ICD-10-CM

## 2024-12-27 DIAGNOSIS — Z23 Encounter for immunization: Secondary | ICD-10-CM | POA: Diagnosis not present

## 2024-12-27 DIAGNOSIS — E119 Type 2 diabetes mellitus without complications: Secondary | ICD-10-CM

## 2024-12-27 DIAGNOSIS — Z720 Tobacco use: Secondary | ICD-10-CM | POA: Diagnosis not present

## 2024-12-27 DIAGNOSIS — E559 Vitamin D deficiency, unspecified: Secondary | ICD-10-CM

## 2024-12-27 DIAGNOSIS — E0590 Thyrotoxicosis, unspecified without thyrotoxic crisis or storm: Secondary | ICD-10-CM

## 2024-12-27 DIAGNOSIS — I10 Essential (primary) hypertension: Secondary | ICD-10-CM | POA: Diagnosis not present

## 2024-12-27 DIAGNOSIS — E785 Hyperlipidemia, unspecified: Secondary | ICD-10-CM | POA: Diagnosis not present

## 2024-12-27 DIAGNOSIS — Z0000 Encounter for general adult medical examination without abnormal findings: Secondary | ICD-10-CM

## 2024-12-27 DIAGNOSIS — Z122 Encounter for screening for malignant neoplasm of respiratory organs: Secondary | ICD-10-CM | POA: Diagnosis not present

## 2024-12-27 MED ORDER — VALSARTAN-HYDROCHLOROTHIAZIDE 160-25 MG PO TABS: 1.0000 | ORAL_TABLET | Freq: Every day | ORAL | 1 refills | Status: AC

## 2024-12-27 MED ORDER — BUPROPION HCL ER (SR) 100 MG PO TB12: ORAL_TABLET | ORAL | 0 refills | Status: AC

## 2024-12-27 MED ORDER — AMLODIPINE BESYLATE 5 MG PO TABS: 5.0000 mg | ORAL_TABLET | Freq: Every day | ORAL | 1 refills | Status: AC

## 2024-12-27 NOTE — Assessment & Plan Note (Addendum)
 Current use of roughly 1/2 PPD, previous use of 1 PPD. Total pack years 46.1   -Encouraged smoking cessation, restarting Wellbutrin   Orders:   buPROPion  ER (WELLBUTRIN  SR) 100 MG 12 hr tablet; Take one tablet by mouth every morning for the first one week, and then begin one tablet by mouth twice a day thereafter

## 2024-12-27 NOTE — Progress Notes (Signed)
 "  Established Patient Office Visit  Subjective   Patient ID: Laura Kent, female    DOB: 04/27/1951  Age: 74 y.o. MRN: [id number]  Chief Complaint  Patient presents with   Transitions Of Care    HPI  Laura Kent is a pleasant 74 year old female who is seen today for transition of care and requests medication refills. She is a new patient to me. She requests a refill on Wellbutrin . She voices she started Wellbutrin  for smoking cessation about 6 months ago, however she has been out of medication for the last few months. She had seen a benefit in therapy noting that smoking cravings were less in the morning but increased in the evening hours. She voices with recent stressors including her brother passing on Christmas morning and buried her brother in law yesterday she has been smoking more. She voices she has smoked more than 1/2 PPD in the last one month but less than 1 PPD. Discussed restarting Wellbutrin  but will start Wellbutrin  Sr 100mg  BID instead of Wellbutrin  Xl 150mg  daily. She is receptive.   She voices she tried to make her eye exam but it was made as virtual and they said she needed an in person exam. She voices she has struggled to schedule an in person visit. Discussed referral to another ophthalmologist for diabetic eye exam, which she is receptive of. Denies vision changes including blurred vision or double vision. She does endorse some light sensitivity but this is with really bright lights. Denies eye pain. She does wear corrective lenses. Last eye exam was last February per patient report.   She did start Rosuvastatin  after her last visit as her and her previous PCP had discussed but she voices she only takes medication 3 nights weekly, voicing if taken nightly it causes her cramping and joint discomfort. Advised to continue 3 nights weekly, will check lipid panel today.       12/27/2024   10:07 AM 04/04/2024    3:55 PM 11/06/2023   10:52 AM  Depression screen PHQ 2/9   Decreased Interest 1 0 0  Down, Depressed, Hopeless 1 0 0  PHQ - 2 Score 2 0 0  Altered sleeping 0 0 0  Tired, decreased energy 0 0 0  Change in appetite 1 0 0  Feeling bad or failure about yourself  0 0 0  Trouble concentrating 0 0 0  Moving slowly or fidgety/restless 0 0 0  Suicidal thoughts 0 0 0  PHQ-9 Score 3 0  0   Difficult doing work/chores  Not difficult at all      Data saved with a previous flowsheet row definition        Review of Systems  Eyes:  Positive for photophobia. Negative for blurred vision, double vision and pain.  Respiratory:  Negative for shortness of breath.   Cardiovascular:  Negative for chest pain.  Neurological:  Negative for dizziness and headaches.      Objective:     BP 122/78   Pulse 89   Resp 16   Ht 5' 5 (1.651 m)   Wt 226 lb (102.5 kg)   SpO2 99%   BMI 37.61 kg/m  BP Readings from Last 3 Encounters:  12/27/24 122/78  05/08/24 126/70  11/06/23 126/78   Wt Readings from Last 3 Encounters:  12/27/24 226 lb (102.5 kg)  05/08/24 221 lb (100.2 kg)  11/06/23 218 lb (98.9 kg)   SpO2 Readings from Last 3 Encounters:  12/27/24 99%  05/08/24 98%  11/06/23 98%      Physical Exam Constitutional:      General: She is not in acute distress.    Appearance: She is obese.  HENT:     Head: Normocephalic and atraumatic.  Eyes:     Conjunctiva/sclera: Conjunctivae normal.  Neck:     Vascular: No carotid bruit.  Cardiovascular:     Rate and Rhythm: Normal rate and regular rhythm.     Heart sounds: Normal heart sounds.  Pulmonary:     Effort: Pulmonary effort is normal.     Breath sounds: Normal breath sounds.  Skin:    General: Skin is warm and dry.  Neurological:     General: No focal deficit present.     Mental Status: She is alert. Mental status is at baseline.  Psychiatric:        Mood and Affect: Mood normal.        Behavior: Behavior normal.     Last CBC Lab Results  Component Value Date   WBC 5.2 11/06/2023    HGB 14.5 11/06/2023   HCT 43.3 11/06/2023   MCV 88.5 11/06/2023   MCH 29.7 11/06/2023   RDW 13.0 11/06/2023   PLT 276 11/06/2023   Last metabolic panel Lab Results  Component Value Date   GLUCOSE 85 05/08/2024   NA 141 05/08/2024   K 4.6 05/08/2024   CL 105 05/08/2024   CO2 28 05/08/2024   BUN 11 05/08/2024   CREATININE 0.75 05/08/2024   EGFR 85 05/08/2024   CALCIUM  10.4 05/08/2024   PROT 6.8 05/08/2024   ALBUMIN 4.2 02/17/2020   LABGLOB 2.3 02/17/2020   AGRATIO 1.8 02/17/2020   BILITOT 0.5 05/08/2024   ALKPHOS 84 02/17/2020   AST 13 05/08/2024   ALT 13 05/08/2024   Last lipids Lab Results  Component Value Date   CHOL 120 11/06/2023   HDL 49 (L) 11/06/2023   LDLCALC 56 11/06/2023   TRIG 74 11/06/2023   CHOLHDL 2.4 11/06/2023   Last hemoglobin A1c Lab Results  Component Value Date   HGBA1C 6.5 (H) 05/08/2024   Last thyroid  functions Lab Results  Component Value Date   TSH 3.33 11/06/2023   T4TOTAL 8.2 11/06/2023   FREET4 1.0 04/21/2022   Last vitamin D  Lab Results  Component Value Date   VD25OH 52 05/03/2023           Assessment & Plan:   Assessment & Plan Type 2 diabetes mellitus without complication, without long-term current use of insulin (HCC) Type 2 DM controlled, last A1c 6.5 in 05/2024. Not requiring pharmacologic therapy.  -Continue with lifestyle changes including dietary changes and regular exercise -Written education provided with AVS -Update labs  Orders:   HgB A1c   Comprehensive Metabolic Panel (CMET)   Lipid Profile   Ambulatory referral to Ophthalmology  Hyperthyroidism Hyperthyroidism controlled, last TSH 3.33 and free T4 1. Hx of ablation therapy, at least if not more than 10 years ago. Further chart review shows hx of radioactive iodine treatment in 2014.  Not currently requiring medication management.   -Update labs Orders:   TSH   T4, free  Hyperlipidemia, unspecified hyperlipidemia type Hyperlipidemia  controlled, last LDL 56 in 11/2023.   -Update labs -Continue dietary changes, written education provided with AVS -Continue Crestor  5mg  3 nights weekly. She declines refill.  Orders:   Comprehensive Metabolic Panel (CMET)   Lipid Profile  Benign hypertension HTN controlled ; BP is 122/78 at today's visit  .  -  Continue Amlodipine  5mg  daily, refill provided -Continue Valsartan -hydrochlorothiazide  160-25mg  daily, refill provided Orders:   Comprehensive Metabolic Panel (CMET)   CBC with Differential/Platelet   amLODipine  (NORVASC ) 5 MG tablet; Take 1 tablet (5 mg total) by mouth daily.   valsartan -hydrochlorothiazide  (DIOVAN -HCT) 160-25 MG tablet; Take 1 tablet by mouth daily.  Tobacco abuse Current use of roughly 1/2 PPD, previous use of 1 PPD. Total pack years 46.1   -Encouraged smoking cessation, restarting Wellbutrin   Orders:   buPROPion  ER (WELLBUTRIN  SR) 100 MG 12 hr tablet; Take one tablet by mouth every morning for the first one week, and then begin one tablet by mouth twice a day thereafter  Encounter for smoking cessation counseling Previously taking Wellbutrin  XL 150mg  daily and reports benefits from therapy, noting less cravings in the morning. She has been out of medication for a few months now. She is agreeable to restarting Wellbutrin . Will start Wellbutrin  100mg  BID with instructions to only take one tablet by mouth every morning for the first one week, and then begin one tablet by mouth twice daily thereafter. She voices understanding and is agreeable. Orders:   buPROPion  ER (WELLBUTRIN  SR) 100 MG 12 hr tablet; Take one tablet by mouth every morning for the first one week, and then begin one tablet by mouth twice a day thereafter  Vitamin D  deficiency Vitamin D  deficiency controlled, last vitamin D  level 52 in 04/2023. She reports she continues OTC vitamin D  supplement.  -Update labs -Advised to continue OTC vitamin D  supplement daily  Orders:   Vitamin D  (25  hydroxy)  Morbid obesity (HCC) BMI 37.61 with above documented chronic conditions.   -Dietary changes advised -Encouraged regular exercise  Orders:   Comprehensive Metabolic Panel (CMET)   Lipid Profile   CBC with Differential/Platelet  Healthcare maintenance -Mammogram UTD, completed 10/2024 and negative. Recommended 1 year follow up (10/2025) -CRC screening UTD, colonoscopy 01/2017 with recommended 10 year follow up (01/2027) -Pap deferred, pt older than 65 -Eligible for CT lung cancer screening, imaging ordered today      Screening for lung cancer Current tobacco use with hx of 1 PPD and currently 1/2 PPD. Eligible for lung cancer screening.   -CT chest lung cancer screening ordered. She is agreeable.  Orders:   CT CHEST LUNG CA SCREEN LOW DOSE W/O CM; Future  Flu vaccine need She reports he has not had her flu vaccine this Fall/ Winter and would like her flu vaccine today.   -Flu vaccine administered  Orders:   Flu vaccine HIGH DOSE PF(Fluzone Trivalent)      Return in about 6 months (around 06/26/2025) for annual physical .    LAYMON LOISE CORE, FNP "

## 2024-12-27 NOTE — Assessment & Plan Note (Deleted)
 HTN controlled ; BP is 122/78 at today's visit  .  -Continue Amlodipine  5mg  daily, refill provided -Continue Valsartan -hydrochlorothiazide  160-25mg  daily, refill provided

## 2024-12-27 NOTE — Assessment & Plan Note (Addendum)
 Type 2 DM controlled, last A1c 6.5 in 05/2024. Not requiring pharmacologic therapy.  -Continue with lifestyle changes including dietary changes and regular exercise -Written education provided with AVS -Update labs  Orders:   HgB A1c   Comprehensive Metabolic Panel (CMET)   Lipid Profile   Ambulatory referral to Ophthalmology

## 2024-12-27 NOTE — Assessment & Plan Note (Addendum)
 HTN controlled ; BP is 122/78 at today's visit  .  -Continue Amlodipine  5mg  daily, refill provided -Continue Valsartan -hydrochlorothiazide  160-25mg  daily, refill provided Orders:   Comprehensive Metabolic Panel (CMET)   CBC with Differential/Platelet   amLODipine  (NORVASC ) 5 MG tablet; Take 1 tablet (5 mg total) by mouth daily.   valsartan -hydrochlorothiazide  (DIOVAN -HCT) 160-25 MG tablet; Take 1 tablet by mouth daily.

## 2024-12-27 NOTE — Assessment & Plan Note (Addendum)
 Hyperthyroidism controlled, last TSH 3.33 and free T4 1. Hx of ablation therapy, at least if not more than 10 years ago. Further chart review shows hx of radioactive iodine treatment in 2014.  Not currently requiring medication management.   -Update labs Orders:   TSH   T4, free

## 2024-12-28 LAB — COMPREHENSIVE METABOLIC PANEL WITH GFR
AG Ratio: 1.9 (calc) (ref 1.0–2.5)
ALT: 11 U/L (ref 6–29)
AST: 12 U/L (ref 10–35)
Albumin: 4.3 g/dL (ref 3.6–5.1)
Alkaline phosphatase (APISO): 75 U/L (ref 37–153)
BUN/Creatinine Ratio: 24 (calc) — ABNORMAL HIGH (ref 6–22)
BUN: 14 mg/dL (ref 7–25)
CO2: 30 mmol/L (ref 20–32)
Calcium: 10.4 mg/dL (ref 8.6–10.4)
Chloride: 105 mmol/L (ref 98–110)
Creat: 0.58 mg/dL — ABNORMAL LOW (ref 0.60–1.00)
Globulin: 2.3 g/dL (ref 1.9–3.7)
Glucose, Bld: 103 mg/dL — ABNORMAL HIGH (ref 65–99)
Potassium: 3.8 mmol/L (ref 3.5–5.3)
Sodium: 142 mmol/L (ref 135–146)
Total Bilirubin: 0.6 mg/dL (ref 0.2–1.2)
Total Protein: 6.6 g/dL (ref 6.1–8.1)
eGFR: 96 mL/min/{1.73_m2}

## 2024-12-28 LAB — CBC WITH DIFFERENTIAL/PLATELET
Absolute Lymphocytes: 1938 {cells}/uL (ref 850–3900)
Absolute Monocytes: 418 {cells}/uL (ref 200–950)
Basophils Absolute: 20 {cells}/uL (ref 0–200)
Basophils Relative: 0.4 %
Eosinophils Absolute: 82 {cells}/uL (ref 15–500)
Eosinophils Relative: 1.6 %
HCT: 40 % (ref 35.9–46.0)
Hemoglobin: 13.4 g/dL (ref 11.7–15.5)
MCH: 29.6 pg (ref 27.0–33.0)
MCHC: 33.5 g/dL (ref 31.6–35.4)
MCV: 88.5 fL (ref 81.4–101.7)
MPV: 10.8 fL (ref 7.5–12.5)
Monocytes Relative: 8.2 %
Neutro Abs: 2642 {cells}/uL (ref 1500–7800)
Neutrophils Relative %: 51.8 %
Platelets: 253 10*3/uL (ref 140–400)
RBC: 4.52 Million/uL (ref 3.80–5.10)
RDW: 12.9 % (ref 11.0–15.0)
Total Lymphocyte: 38 %
WBC: 5.1 10*3/uL (ref 3.8–10.8)

## 2024-12-28 LAB — LIPID PANEL
Cholesterol: 79 mg/dL
HDL: 47 mg/dL — ABNORMAL LOW
LDL Cholesterol (Calc): 19 mg/dL
Non-HDL Cholesterol (Calc): 32 mg/dL
Total CHOL/HDL Ratio: 1.7 (calc)
Triglycerides: 51 mg/dL

## 2024-12-28 LAB — VITAMIN D 25 HYDROXY (VIT D DEFICIENCY, FRACTURES): Vit D, 25-Hydroxy: 77 ng/mL (ref 30–100)

## 2024-12-28 LAB — HEMOGLOBIN A1C
Hgb A1c MFr Bld: 6.4 % — ABNORMAL HIGH
Mean Plasma Glucose: 137 mg/dL
eAG (mmol/L): 7.6 mmol/L

## 2024-12-28 LAB — TSH: TSH: 2.08 m[IU]/L (ref 0.40–4.50)

## 2024-12-28 LAB — T4, FREE: Free T4: 1.1 ng/dL (ref 0.8–1.8)

## 2024-12-29 ENCOUNTER — Ambulatory Visit: Payer: Self-pay | Admitting: Family Medicine

## 2025-04-24 ENCOUNTER — Ambulatory Visit

## 2025-06-26 ENCOUNTER — Ambulatory Visit: Admitting: Family Medicine
